# Patient Record
Sex: Male | Born: 1945
Health system: Southern US, Community
[De-identification: ages and names within clinical notes are randomized; demographics above are authoritative.]

## PROBLEM LIST (undated history)

## (undated) DIAGNOSIS — R0683 Snoring: Secondary | ICD-10-CM

## (undated) DIAGNOSIS — I499 Cardiac arrhythmia, unspecified: Secondary | ICD-10-CM

## (undated) DIAGNOSIS — I428 Other cardiomyopathies: Secondary | ICD-10-CM

## (undated) DIAGNOSIS — Z87898 Personal history of other specified conditions: Secondary | ICD-10-CM

## (undated) DIAGNOSIS — Z9889 Other specified postprocedural states: Secondary | ICD-10-CM

## (undated) DIAGNOSIS — I2510 Atherosclerotic heart disease of native coronary artery without angina pectoris: Secondary | ICD-10-CM

## (undated) DIAGNOSIS — F99 Mental disorder, not otherwise specified: Secondary | ICD-10-CM

## (undated) DIAGNOSIS — M1990 Unspecified osteoarthritis, unspecified site: Secondary | ICD-10-CM

## (undated) DIAGNOSIS — I493 Ventricular premature depolarization: Secondary | ICD-10-CM

## (undated) DIAGNOSIS — N529 Male erectile dysfunction, unspecified: Secondary | ICD-10-CM

## (undated) DIAGNOSIS — Z8719 Personal history of other diseases of the digestive system: Secondary | ICD-10-CM

## (undated) DIAGNOSIS — C801 Malignant (primary) neoplasm, unspecified: Secondary | ICD-10-CM

## (undated) DIAGNOSIS — I5020 Unspecified systolic (congestive) heart failure: Secondary | ICD-10-CM

## (undated) HISTORY — DX: Ventricular premature depolarization: I49.3

## (undated) HISTORY — DX: Personal history of other diseases of the digestive system: Z87.19

## (undated) HISTORY — DX: Atherosclerotic heart disease of native coronary artery without angina pectoris: I25.10

## (undated) HISTORY — DX: Unspecified systolic (congestive) heart failure: I50.20

## (undated) HISTORY — DX: Male erectile dysfunction, unspecified: N52.9

## (undated) HISTORY — DX: Cardiac arrhythmia, unspecified: I49.9

## (undated) HISTORY — PX: HERNIA REPAIR: SHX51

## (undated) HISTORY — DX: Other specified postprocedural states: Z98.890

## (undated) HISTORY — DX: Other cardiomyopathies: I42.8

---
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
## 2003-03-29 ENCOUNTER — Emergency Department (HOSPITAL_COMMUNITY): Admission: EM | Admit: 2003-03-29 | Discharge: 2003-03-29 | Payer: Self-pay | Admitting: Emergency Medicine

## 2003-03-29 ENCOUNTER — Encounter: Payer: Self-pay | Admitting: Emergency Medicine

## 2003-10-14 ENCOUNTER — Ambulatory Visit (HOSPITAL_COMMUNITY): Admission: RE | Admit: 2003-10-14 | Discharge: 2003-10-14 | Payer: Self-pay | Admitting: Internal Medicine

## 2011-07-13 ENCOUNTER — Ambulatory Visit (INDEPENDENT_AMBULATORY_CARE_PROVIDER_SITE_OTHER): Payer: BC Managed Care – PPO | Admitting: General Surgery

## 2011-07-13 ENCOUNTER — Encounter (INDEPENDENT_AMBULATORY_CARE_PROVIDER_SITE_OTHER): Payer: Self-pay | Admitting: General Surgery

## 2011-07-13 VITALS — BP 112/72 | HR 80 | Temp 98.2°F | Resp 20 | Ht 72.0 in | Wt 215.2 lb

## 2011-07-13 DIAGNOSIS — K4091 Unilateral inguinal hernia, without obstruction or gangrene, recurrent: Secondary | ICD-10-CM | POA: Insufficient documentation

## 2011-07-13 NOTE — Patient Instructions (Signed)
Call we are ready to schedule surgery.

## 2011-07-13 NOTE — Progress Notes (Signed)
Subjective:   Right groin hernia  Patient ID: Frederick Goodwin, male   DOB: 1945-11-24, 66 y.o.   MRN: [id number]  HPI Patient is a very pleasant 66 year old male who is the father of Judeth Cornfield [place] at [facility]. For about 2-3 months he has noted a gradually increasing bulge in his right groin. This has been somewhat uncomfortable but no severe pain. He's had nothing that sounds like incarceration. He has no GI symptoms. His colonoscopy is up to date. He has a significant history of right inguinal hernia repair as an infant. He has not noted any symptoms on the left.  Past Medical History  Diagnosis Date  . History of inguinal hernia repair     right   Past Surgical History  Procedure Date  . Hernia repair     RIH as a child   Current Outpatient Prescriptions  Medication Sig Dispense Refill  . fish oil-omega-3 fatty acids 1000 MG capsule Take 2 g by mouth daily.       No Known Allergies History  Substance Use Topics  . Smoking status: Former Games developer  . Smokeless tobacco: Not on file  . Alcohol Use: Yes  Solicitor  Review of Systems  Constitutional: Negative.   HENT: Negative.   Respiratory: Negative.   Cardiovascular: Negative.   Gastrointestinal: Negative.        Objective:   Physical Exam BP 112/72  Pulse 80  Temp(Src) 98.2 F (36.8 C) (Temporal)  Resp 20  Ht 6' (1.829 m)  Wt 215 lb 3.2 oz (97.614 kg)  BMI 29.19 kg/m2 Body mass index is 29.19 kg/(m^2). lGeneral: Alert, mildly overweight Caucasian male, in no distress Skin: Warm and dry without rash or infection. HEENT: No palpable masses or thyromegaly. Sclera nonicteric. Pupils equal round and reactive. Oropharynx clear. Lymph nodes: No cervical, supraclavicular, or inguinal nodes palpable. Lungs: Breath sounds clear and equal without increased work of breathing Cardiovascular: Regular rate and rhythm without murmur. No JVD or edema. Peripheral pulses intact. Abdomen: Nondistended. Soft and nontender.  No masses palpable. No organomegaly. In the right groin there is a moderate sized somewhat tender reducible right inguinal hernia that contains bowel. I cannot feel any hernia on the left. Extremities: No edema or joint swelling or deformity. No chronic venous stasis changes. Neurologic: Alert and fully oriented. Gait normal.     Assessment:     Symptomatic recurrent right inguinal hernia. I've recommended laparoscopic repair due to the recurrent nature. We discussed the nature of the procedure, indications, risks of anesthetic complications, bleeding, infection, recurrence, chronic pain. He would like to think about scheduling and call us back.    Plan:     Laparoscopic repair of recurrent right inguinal hernia under general anesthesia as an outpatient.

## 2011-08-01 ENCOUNTER — Encounter (HOSPITAL_COMMUNITY): Payer: Self-pay | Admitting: Pharmacy Technician

## 2011-08-08 ENCOUNTER — Other Ambulatory Visit (INDEPENDENT_AMBULATORY_CARE_PROVIDER_SITE_OTHER): Payer: Self-pay | Admitting: General Surgery

## 2011-08-09 ENCOUNTER — Other Ambulatory Visit (HOSPITAL_COMMUNITY): Payer: BC Managed Care – PPO

## 2011-08-09 ENCOUNTER — Encounter (HOSPITAL_COMMUNITY): Payer: Self-pay

## 2011-08-09 ENCOUNTER — Encounter (HOSPITAL_COMMUNITY)
Admission: RE | Admit: 2011-08-09 | Discharge: 2011-08-09 | Disposition: A | Discharge status: Home / Self Care | Payer: BC Managed Care – PPO | Source: Ambulatory Visit | Attending: General Surgery | Admitting: General Surgery

## 2011-08-09 HISTORY — DX: Mental disorder, not otherwise specified: F99

## 2011-08-09 HISTORY — DX: Unspecified osteoarthritis, unspecified site: M19.90

## 2011-08-09 HISTORY — DX: Personal history of other specified conditions: Z87.898

## 2011-08-09 HISTORY — DX: Snoring: R06.83

## 2011-08-09 HISTORY — DX: Malignant (primary) neoplasm, unspecified: C80.1

## 2011-08-09 LAB — CBC
HCT: 42 % (ref 39.0–52.0)
Hemoglobin: 15 g/dL (ref 13.0–17.0)
MCH: 33.1 pg (ref 26.0–34.0)
MCHC: 35.7 g/dL (ref 30.0–36.0)
MCV: 92.7 fL (ref 78.0–100.0)
Platelets: 240 10*3/uL (ref 150–400)
RBC: 4.53 MIL/uL (ref 4.22–5.81)
RDW: 12.9 % (ref 11.5–15.5)
WBC: 6.5 10*3/uL (ref 4.0–10.5)

## 2011-08-09 LAB — SURGICAL PCR SCREEN
MRSA, PCR: NEGATIVE
Staphylococcus aureus: NEGATIVE

## 2011-08-09 MED ORDER — CHLORHEXIDINE GLUCONATE 4 % EX LIQD: 1.0000 "application " | Freq: Once | CUTANEOUS | Status: DC

## 2011-08-09 NOTE — Patient Instructions (Signed)
20 Frederick Goodwin  08/09/2011   Your procedure is scheduled on:  08/14/10  Report to [facility] Stay [facility] at 6:30 AM.  Call this number if you have problems the morning of surgery: [phone]   Remember:   Do not eat food:After Midnight.  May have clear liquids: up to 4 Hrs. before arrival.( 6 HRS BEFORE SURGERY)  Clear liquids include soda, tea, black coffee, apple or grape juice, broth.  Take these medicines the morning of surgery with A SIP OF WATER: NONE   Do not wear jewelry, make-up or nail polish.  Do not wear lotions, powders, or perfumes.   Do not shave underarms or legs 48 hours prior to surgery (men may shave face)  Do not bring valuables to the hospital.  Contacts, dentures or bridgework may not be worn into surgery.  Leave suitcase in the car. After surgery it may be brought to your room.  For patients admitted to the hospital, checkout time is 11:00 AM the day of discharge.   Patients discharged the day of surgery will not be allowed to drive home.  Name and phone number of your driver:   Special Instructions: CHG Shower Use Special Wash: 1/2 bottle night before surgery and 1/2 bottle morning of surgery.   Please read over the following fact sheets that you were given: MRSA Information  20 Frederick Goodwin  08/09/2011

## 2011-08-15 ENCOUNTER — Ambulatory Visit (HOSPITAL_COMMUNITY)
Admission: RE | Admit: 2011-08-15 | Discharge: 2011-08-15 | Disposition: A | Discharge status: Home / Self Care | Payer: BC Managed Care – PPO | Source: Ambulatory Visit | Attending: General Surgery | Admitting: General Surgery

## 2011-08-15 ENCOUNTER — Ambulatory Visit (HOSPITAL_COMMUNITY): Payer: BC Managed Care – PPO | Admitting: Anesthesiology

## 2011-08-15 ENCOUNTER — Encounter (HOSPITAL_COMMUNITY): Payer: Self-pay | Admitting: Anesthesiology

## 2011-08-15 ENCOUNTER — Encounter (HOSPITAL_COMMUNITY)
Admission: RE | Disposition: A | Discharge status: Home / Self Care | Payer: Self-pay | Source: Ambulatory Visit | Attending: General Surgery

## 2011-08-15 ENCOUNTER — Encounter (HOSPITAL_COMMUNITY): Payer: Self-pay | Admitting: *Deleted

## 2011-08-15 DIAGNOSIS — K4091 Unilateral inguinal hernia, without obstruction or gangrene, recurrent: Secondary | ICD-10-CM

## 2011-08-15 DIAGNOSIS — Z01812 Encounter for preprocedural laboratory examination: Secondary | ICD-10-CM | POA: Insufficient documentation

## 2011-08-15 HISTORY — PX: INGUINAL HERNIA REPAIR: SHX194

## 2011-08-15 SURGERY — REPAIR, HERNIA, INGUINAL, LAPAROSCOPIC
Anesthesia: General | Site: Groin | Laterality: Right | Wound class: Clean

## 2011-08-15 MED ORDER — LACTATED RINGERS IV SOLN: INTRAVENOUS | Status: DC

## 2011-08-15 MED ORDER — OXYCODONE-ACETAMINOPHEN 5-325 MG PO TABS: 1.0000 | ORAL_TABLET | ORAL | Status: AC | PRN

## 2011-08-15 MED ORDER — NEOSTIGMINE METHYLSULFATE 1 MG/ML IJ SOLN
INTRAMUSCULAR | Status: DC | PRN
  Administered 2011-08-15: 5 mg via INTRAVENOUS

## 2011-08-15 MED ORDER — BUPIVACAINE LIPOSOME 1.3 % IJ SUSP
20.0000 mL | INTRAMUSCULAR | Status: DC
  Filled 2011-08-15: qty 20

## 2011-08-15 MED ORDER — DROPERIDOL 2.5 MG/ML IJ SOLN
INTRAMUSCULAR | Status: DC | PRN
  Administered 2011-08-15: 0.625 mg via INTRAVENOUS

## 2011-08-15 MED ORDER — BUPIVACAINE-EPINEPHRINE PF 0.25-1:200000 % IJ SOLN
INTRAMUSCULAR | Status: AC
  Filled 2011-08-15: qty 60

## 2011-08-15 MED ORDER — LACTATED RINGERS IV SOLN
INTRAVENOUS | Status: DC | PRN
  Administered 2011-08-15 (×2): via INTRAVENOUS

## 2011-08-15 MED ORDER — LIDOCAINE HCL (CARDIAC) 20 MG/ML IV SOLN
INTRAVENOUS | Status: DC | PRN
  Administered 2011-08-15: 100 mg via INTRAVENOUS

## 2011-08-15 MED ORDER — PROPOFOL 10 MG/ML IV EMUL
INTRAVENOUS | Status: DC | PRN
  Administered 2011-08-15: 200 mg via INTRAVENOUS

## 2011-08-15 MED ORDER — PROMETHAZINE HCL 25 MG/ML IJ SOLN: 6.2500 mg | INTRAMUSCULAR | Status: DC | PRN

## 2011-08-15 MED ORDER — SUCCINYLCHOLINE CHLORIDE 20 MG/ML IJ SOLN
INTRAMUSCULAR | Status: DC | PRN
  Administered 2011-08-15: 140 mg via INTRAVENOUS

## 2011-08-15 MED ORDER — CEFAZOLIN SODIUM-DEXTROSE 2-3 GM-% IV SOLR
2.0000 g | Freq: Once | INTRAVENOUS | Status: AC
  Administered 2011-08-15: 2 g via INTRAVENOUS

## 2011-08-15 MED ORDER — ONDANSETRON HCL 4 MG/2ML IJ SOLN
INTRAMUSCULAR | Status: DC | PRN
  Administered 2011-08-15: 4 mg via INTRAVENOUS

## 2011-08-15 MED ORDER — BUPIVACAINE-EPINEPHRINE 0.25% -1:200000 IJ SOLN
INTRAMUSCULAR | Status: DC | PRN
  Administered 2011-08-15 (×2): 10 mL

## 2011-08-15 MED ORDER — FENTANYL CITRATE 0.05 MG/ML IJ SOLN
INTRAMUSCULAR | Status: DC | PRN
  Administered 2011-08-15: 50 ug via INTRAVENOUS
  Administered 2011-08-15 (×2): 100 ug via INTRAVENOUS

## 2011-08-15 MED ORDER — ROCURONIUM BROMIDE 100 MG/10ML IV SOLN
INTRAVENOUS | Status: DC | PRN
  Administered 2011-08-15 (×2): 10 mg via INTRAVENOUS
  Administered 2011-08-15: 40 mg via INTRAVENOUS

## 2011-08-15 MED ORDER — HYDROMORPHONE HCL PF 1 MG/ML IJ SOLN
INTRAMUSCULAR | Status: DC | PRN
  Administered 2011-08-15 (×2): 1 mg via INTRAVENOUS

## 2011-08-15 MED ORDER — MIDAZOLAM HCL 5 MG/5ML IJ SOLN
INTRAMUSCULAR | Status: DC | PRN
  Administered 2011-08-15: 2 mg via INTRAVENOUS

## 2011-08-15 MED ORDER — FENTANYL CITRATE 0.05 MG/ML IJ SOLN: 25.0000 ug | INTRAMUSCULAR | Status: DC | PRN

## 2011-08-15 MED ORDER — GLYCOPYRROLATE 0.2 MG/ML IJ SOLN
INTRAMUSCULAR | Status: DC | PRN
  Administered 2011-08-15: .7 mg via INTRAVENOUS

## 2011-08-15 MED ORDER — ACETAMINOPHEN 10 MG/ML IV SOLN
INTRAVENOUS | Status: AC
  Filled 2011-08-15: qty 100

## 2011-08-15 MED ORDER — DEXAMETHASONE SODIUM PHOSPHATE 10 MG/ML IJ SOLN
INTRAMUSCULAR | Status: DC | PRN
  Administered 2011-08-15: 10 mg via INTRAVENOUS

## 2011-08-15 MED ORDER — ACETAMINOPHEN 10 MG/ML IV SOLN
INTRAVENOUS | Status: DC | PRN
  Administered 2011-08-15: 1000 mg via INTRAVENOUS

## 2011-08-15 MED ORDER — CEFAZOLIN SODIUM-DEXTROSE 2-3 GM-% IV SOLR
INTRAVENOUS | Status: AC
  Filled 2011-08-15: qty 50

## 2011-08-15 SURGICAL SUPPLY — 40 items
ADH SKN CLS APL DERMABOND .7 (GAUZE/BANDAGES/DRESSINGS) ×1
APL SKNCLS STERI-STRIP NONHPOA (GAUZE/BANDAGES/DRESSINGS) ×1
APPLIER CLIP 5 13 M/L LIGAMAX5 (MISCELLANEOUS)
APR CLP MED LRG 5 ANG JAW (MISCELLANEOUS)
BENZOIN TINCTURE PRP APPL 2/3 (GAUZE/BANDAGES/DRESSINGS) ×2 IMPLANT
CLIP APPLIE 5 13 M/L LIGAMAX5 (MISCELLANEOUS) IMPLANT
CLOTH BEACON ORANGE TIMEOUT ST (SAFETY) ×2 IMPLANT
DECANTER SPIKE VIAL GLASS SM (MISCELLANEOUS) ×1 IMPLANT
DERMABOND ADVANCED (GAUZE/BANDAGES/DRESSINGS) ×1
DERMABOND ADVANCED .7 DNX12 (GAUZE/BANDAGES/DRESSINGS) IMPLANT
DEVICE SECURE STRAP 25 ABSORB (INSTRUMENTS) ×1 IMPLANT
DISSECT BALLN SPACEMKR + OVL (BALLOONS) ×2
DISSECTOR BALLN SPACEMKR + OVL (BALLOONS) ×1 IMPLANT
DISSECTOR BLUNT TIP ENDO 5MM (MISCELLANEOUS) IMPLANT
DRAPE INCISE IOBAN 66X45 STRL (DRAPES) ×1 IMPLANT
DRAPE LAPAROSCOPIC ABDOMINAL (DRAPES) ×2 IMPLANT
ELECT REM PT RETURN 9FT ADLT (ELECTROSURGICAL) ×2
ELECTRODE REM PT RTRN 9FT ADLT (ELECTROSURGICAL) ×1 IMPLANT
GLOVE BIOGEL PI IND STRL 7.0 (GLOVE) ×1 IMPLANT
GLOVE BIOGEL PI INDICATOR 7.0 (GLOVE) ×1
GOWN STRL NON-REIN LRG LVL3 (GOWN DISPOSABLE) ×3 IMPLANT
GOWN STRL REIN XL XLG (GOWN DISPOSABLE) ×4 IMPLANT
KIT BASIN OR (CUSTOM PROCEDURE TRAY) ×2 IMPLANT
MESH 3DMAX 5X7 RT XLRG (Mesh General) ×1 IMPLANT
NDL INSUFFLATION 14GA 120MM (NEEDLE) IMPLANT
NEEDLE INSUFFLATION 14GA 120MM (NEEDLE) ×2 IMPLANT
PEN SKIN MARKING BROAD (MISCELLANEOUS) ×2 IMPLANT
SCISSORS LAP 5X35 DISP (ENDOMECHANICALS) IMPLANT
SET IRRIG TUBING LAPAROSCOPIC (IRRIGATION / IRRIGATOR) IMPLANT
SOLUTION ANTI FOG 6CC (MISCELLANEOUS) ×2 IMPLANT
STRIP CLOSURE SKIN 1/2X4 (GAUZE/BANDAGES/DRESSINGS) ×2 IMPLANT
SUT MNCRL AB 4-0 PS2 18 (SUTURE) ×2 IMPLANT
SUT VIC AB 3-0 SH 27 (SUTURE)
SUT VIC AB 3-0 SH 27XBRD (SUTURE) IMPLANT
TACKER 5MM HERNIA 3.5CML NAB (ENDOMECHANICALS) ×2 IMPLANT
TOWEL OR 17X26 10 PK STRL BLUE (TOWEL DISPOSABLE) ×2 IMPLANT
TRAY FOLEY CATH 14FRSI W/METER (CATHETERS) ×2 IMPLANT
TRAY LAP CHOLE (CUSTOM PROCEDURE TRAY) ×2 IMPLANT
TROCAR CANNULA W/PORT DUAL 5MM (MISCELLANEOUS) ×4 IMPLANT
TUBING INSUFFLATION 10FT LAP (TUBING) ×2 IMPLANT

## 2011-08-15 NOTE — H&P (Signed)
  Subjective:   Patient is a very pleasant 66 year old male who is the father of Judeth Cornfield [place] at [facility]. For about 2-3 months he has noted a gradually increasing bulge in his right groin. This has been somewhat uncomfortable but no severe pain. He's had nothing that sounds like incarceration. He has no GI symptoms. His colonoscopy is up to date. He has a significant history of right inguinal hernia repair as an infant. He has not noted any symptoms on the left.      Objective:   BP 144/86  Pulse 73  Temp 97.7 F (36.5 C)  Resp 20  SpO2 100%  General:  alert and no distress Skin:  normal and no rash or abnormalities  Lungs:  clear to auscultation bilaterally Heart:  regular rate and rhythm Abdomen: normal findings: soft, non-tender, reducible RIH  Extremities:  extremities normal, atraumatic, no cyanosis or edema Neurologic:  negative Psychiatric:  normal mood, behavior, speech, dress, and thought processes    Assessment: Symptomatic reducible RIH   Plan:   1. Discussed the risk of surgery,  and the risks of general anesthetic including MI, CVA, sudden death or even reaction to anesthetic medications. The patient understands the risks, any and all questions were answered to the patient's satisfaction.   Date of Surgery Update  Mariella Saa MD, FACS  08/15/2011, 8:25 AM

## 2011-08-15 NOTE — Progress Notes (Signed)
Up to BR and voided small amt of urine. Ambulated in hall with very little assist 200 feet ant tolerated this well. Pt sitting in room drinking water at this time

## 2011-08-15 NOTE — Op Note (Signed)
Preoperative Diagnosis: right inguinal hernia, recurrent  Postoprative Diagnosis: right inguinal hernia, recurrent  Procedure: Procedure(s): LAPAROSCOPIC RECURRENT INGUINAL HERNIA REPAIR INSERTION OF MESH   Surgeon: Glenna Fellows T   Assistants: None  Anesthesia:  General endotracheal anesthesiaDiagnos  Indications:  Patient is a 66 year old male with a history of right inguinal hernia repair as a child. He now presents with an increasing and uncomfortable bulge at the right groin exam shows a moderate-sized reducible right inguinal hernia. We discussed options for repair and elected to proceed with laparoscopic repair. We discussed the nature of the procedure, its indications, risks of general anesthesia, cardiorespiratory complications, bleeding, infection, recurrence, possible need for open procedure. He is in agreement.   Procedure Detail: Patient is brought to the operating room, placed in the supine position on the operating table, and general endotracheal anesthesia induced. Foley catheter was placed. He received preoperative IV antibiotics. The abdomen and groins were widely sterilely prepped and draped and correct patient and procedure verified.  Local anesthesia was used to infiltrate the trocar sites. A 1-1/2 cm incision was made just beneath the umbilicus and dissection carried down to the anterior fascia. This was incised for 1 cm transversely just to the right of midline and the medial edge of the right rectus muscle retracted laterally and the preperitoneal space entered under direct vision. The balloon dissector was passed with its tip into the space and then down along the midline to the pubis. Under direct laparoscopic vision the balloon was inflated with good deployment. It was left in place for several minutes for hemostasis then removed and the balloon trocar inflated and CO2 pressure applied. There had been good dissection of the preperitoneal space. Under direct vision  25 mm trochars were placed. The pubic symphysis was identified and was cleared. There was an obvious direct hernia was seen the sac and preperitoneal fat adherent up to the pseudo-sac. I initially dissected laterally clearing peritoneum off the anterior abdominal wall out to the level of the anterior superior iliac spine as high as the umbilicus. The peritoneal edge was then followed back medially. The epigastric vessels and cord structures were identified. The peritoneum was posterior the cord structures and there was no evidence of indirect hernia. The herniated preperitoneal fat was stripped away from the pseudo-sac and a fairly large direct defect completely dissected and cleared posteriorly down to Cooper's ligament. Cooper's ligament was cleared and the iliac vessels which were protected. After this complete dissection a large right-sided Bard 3-D Max was chosen and introduced into the preperitoneal space. It was oriented and intact medially to the pubis and down along Cooper's ligament down to and avoiding the iliac vessels. The mesh was deployed well out laterally and beginning along the superior edge we could feel the tacker through the anterior abdominal wall the superior edge was tacked working back medially and finally the medial edge of the mesh was tacked. Several other tacks were placed in the inguinal ligament again be able to feel the tacker. This provided broad coverage of the direct and indirect spaces. Hemostasis was assured. All CO2 was evacuated and trochars removed. There was noted to be a moderate pneumoperitoneum and this was evacuated without difficulty with a varies needle in the right upper quadrant. The fascial defect at the umbilicus was closed with a figure-of-eight suture of 0 Vicryl. Skin was closed with subcuticular Monocryl and Dermabond. Sponge needle and instrument counts were correct.   Findings: Large direct hernia  Estimated Blood Loss:  Minimal  Drains:  none  Blood Given: none          Specimens: none        Complications:  * No complications entered in OR log *         Disposition: PACU - hemodynamically stable.         Condition: stable  Mariella Saa MD, FACS  08/15/2011, 10:17 AM

## 2011-08-15 NOTE — Progress Notes (Signed)
Again to BR and voided small amount. Bladder scan shows 246cc. Pt ambulating in hall without problem

## 2011-08-15 NOTE — Preoperative (Signed)
Beta Blockers   Reason not to administer Beta Blockers:Not Applicable 

## 2011-08-15 NOTE — Discharge Instructions (Signed)
[facility] _______[facility]  UMBILICAL OR INGUINAL HERNIA REPAIR: POST OP INSTRUCTIONS  Always review your discharge instruction sheet given to you by the facility where your surgery was performed. IF YOU HAVE DISABILITY OR FAMILY LEAVE FORMS, YOU MUST BRING THEM TO THE OFFICE FOR PROCESSING.   DO NOT GIVE THEM TO YOUR DOCTOR.  1. A  prescription for pain medication may be given to you upon discharge.  Take your pain medication as prescribed, if needed.  If narcotic pain medicine is not needed, then you may take acetaminophen (Tylenol) or ibuprofen (Advil) as needed. 2. Take your usually prescribed medications unless otherwise directed. 3. If you need a refill on your pain medication, please contact your pharmacy.  They will contact our office to request authorization. Prescriptions will not be filled after 5 pm or on week-ends. 4. You should follow a light diet the first 24 hours after arrival home, such as soup and crackers, etc.  Be sure to include lots of fluids daily.  Resume your normal diet the day after surgery. 5. Most patients will experience some swelling and bruising around the umbilicus or in the groin and scrotum.  Ice packs and reclining will help.  Swelling and bruising can take several days to resolve.  6. It is common to experience some constipation if taking pain medication after surgery.  Increasing fluid intake and taking a stool softener (such as Colace) will usually help or prevent this problem from occurring.  A mild laxative (Milk of Magnesia or Miralax) should be taken according to package directions if there are no bowel movements after 48 hours. 7. Unless discharge instructions indicate otherwise, you may remove your bandages 24-48 hours after surgery, and you may shower at that time.  You may have steri-strips (small skin tapes) in place directly over the incision.  These strips should be left on the skin for 7-10 days.  If your surgeon used skin glue on the  incision, you may shower in 24 hours.  The glue will flake off over the next 2-3 weeks.  Any sutures or staples will be removed at the office during your follow-up visit. 8. ACTIVITIES:  You may resume regular (light) daily activities beginning the next day--such as daily self-care, walking, climbing stairs--gradually increasing activities as tolerated.  You may have sexual intercourse when it is comfortable.  Refrain from any heavy lifting or straining until approved by your doctor. a. You may drive when you are no longer taking prescription pain medication, you can comfortably wear a seatbelt, and you can safely maneuver your car and apply brakes. b. RETURN TO WORK:  __________________________________________________________ 9. You should see your doctor in the office for a follow-up appointment approximately 2-3 weeks after your surgery.  Make sure that you call for this appointment within a day or two after you arrive home to insure a convenient appointment time. 10. OTHER INSTRUCTIONS:  __________________________________________________________________________________________________________________________________________________________________________________________  WHEN TO CALL YOUR DOCTOR: 1. Fever over 101.0 2. Inability to urinate 3. Nausea and/or vomiting 4. Extreme swelling or bruising 5. Continued bleeding from incision. 6. Increased pain, redness, or drainage from the incision  The clinic staff is available to answer your questions during regular business hours.  Please don't hesitate to call and ask to speak to one of the nurses for clinical concerns.  If you have a medical emergency, go to the nearest emergency room or call 911.  A surgeon from [facility] is always on call at the hospital     [address], [address], [place], [place]  [address] ?  [address] [address], [place], [place]   [address] [phone] ? [phone] ? FAX [phone] Web site:  [email or web address]  

## 2011-08-15 NOTE — Transfer of Care (Signed)
Immediate Anesthesia Transfer of Care Note  Patient: Frederick Goodwin  Procedure(s) Performed: Procedure(s) (LRB): LAPAROSCOPIC INGUINAL HERNIA (Right) INSERTION OF MESH (Right)  Patient Location: PACU  Anesthesia Type: General  Level of Consciousness: awake, alert , sedated and patient cooperative  Airway & Oxygen Therapy: Patient Spontanous Breathing and Patient connected to face mask oxygen  Post-op Assessment: Report given to PACU RN, Post -op Vital signs reviewed and stable and Patient moving all extremities  Post vital signs: Reviewed and stable  Complications: No apparent anesthesia complications

## 2011-08-15 NOTE — Progress Notes (Signed)
Up to BR voided moderate amount clear yellow urine

## 2011-08-15 NOTE — Anesthesia Postprocedure Evaluation (Signed)
Anesthesia Post Note  Patient: Frederick Goodwin  Procedure(s) Performed: Procedure(s) (LRB): LAPAROSCOPIC INGUINAL HERNIA (Right) INSERTION OF MESH (Right)  Anesthesia type: General  Patient location: PACU  Post pain: Pain level controlled  Post assessment: Post-op Vital signs reviewed  Last Vitals:  Filed Vitals:   08/15/11 1100  BP:   Pulse: 63  Temp:   Resp: 19    Post vital signs: Reviewed  Level of consciousness: sedated  Complications: No apparent anesthesia complications

## 2011-08-15 NOTE — Anesthesia Preprocedure Evaluation (Addendum)
Anesthesia Evaluation  Patient identified by MRN, date of birth, ID band Patient awake    Reviewed: Allergy & Precautions, H&P , NPO status , Patient's Chart, lab work & pertinent test results  History of Anesthesia Complications Negative for: history of anesthetic complications  Airway Mallampati: II TM Distance: >3 FB Neck ROM: Full    Dental  (+) Teeth Intact and Dental Advisory Given   Pulmonary neg pulmonary ROS,  clear to auscultation  Pulmonary exam normal       Cardiovascular neg cardio ROS Regular Normal    Neuro/Psych Negative Neurological ROS  Negative Psych ROS   GI/Hepatic negative GI ROS, Neg liver ROS,   Endo/Other  Negative Endocrine ROS  Renal/GU negative Renal ROS  Genitourinary negative   Musculoskeletal negative musculoskeletal ROS (+)   Abdominal Normal abdominal exam  (+)   Peds negative pediatric ROS (+)  Hematology negative hematology ROS (+)   Anesthesia Other Findings   Reproductive/Obstetrics negative OB ROS                           Anesthesia Physical Anesthesia Plan  ASA: I  Anesthesia Plan: General   Post-op Pain Management:    Induction: Intravenous  Airway Management Planned: Oral ETT  Additional Equipment:   Intra-op Plan:   Post-operative Plan: Extubation in OR  Informed Consent: I have reviewed the patients History and Physical, chart, labs and discussed the procedure including the risks, benefits and alternatives for the proposed anesthesia with the patient or authorized representative who has indicated his/her understanding and acceptance.   Dental advisory given  Plan Discussed with: CRNA  Anesthesia Plan Comments:         Anesthesia Quick Evaluation

## 2011-08-20 ENCOUNTER — Telehealth (INDEPENDENT_AMBULATORY_CARE_PROVIDER_SITE_OTHER): Payer: Self-pay

## 2011-08-20 NOTE — Telephone Encounter (Signed)
Patient called asking questions of what he can do activity wise. I told him no lifting over 20 lbs for 6 weeks. 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 I told him to take it easy at first and increase activity as he gets further out from surgery.

## 2011-08-30 ENCOUNTER — Ambulatory Visit (INDEPENDENT_AMBULATORY_CARE_PROVIDER_SITE_OTHER): Payer: BC Managed Care – PPO | Admitting: General Surgery

## 2011-08-30 ENCOUNTER — Encounter (INDEPENDENT_AMBULATORY_CARE_PROVIDER_SITE_OTHER): Payer: Self-pay | Admitting: General Surgery

## 2011-08-30 VITALS — BP 136/88 | HR 90 | Temp 98.2°F | Ht 72.0 in | Wt 212.6 lb

## 2011-08-30 DIAGNOSIS — Z09 Encounter for follow-up examination after completed treatment for conditions other than malignant neoplasm: Secondary | ICD-10-CM

## 2011-08-30 NOTE — Progress Notes (Signed)
History: Patient returns approximately 2 weeks following laparoscopic repair of recurrent right femoral hernia. He's had some expected soreness that is up and around without too much difficulty and back at work. No significant pain or other major complaints.  Exam: The repair feels solid. Incisions are well-healed without complications.  Assessment plan: Doing well following laparoscopic repair of recurrent right inguinal hernia. We discussed gradual return to full activity over the next week or so. Will return as needed.

## 2011-09-03 ENCOUNTER — Encounter (HOSPITAL_COMMUNITY): Payer: Self-pay | Admitting: General Surgery

## 2011-09-14 ENCOUNTER — Encounter (INDEPENDENT_AMBULATORY_CARE_PROVIDER_SITE_OTHER): Payer: BC Managed Care – PPO | Admitting: General Surgery

## 2016-03-30 DIAGNOSIS — N529 Male erectile dysfunction, unspecified: Secondary | ICD-10-CM | POA: Diagnosis not present

## 2016-03-30 DIAGNOSIS — Z136 Encounter for screening for cardiovascular disorders: Secondary | ICD-10-CM | POA: Diagnosis not present

## 2016-03-30 DIAGNOSIS — Z1211 Encounter for screening for malignant neoplasm of colon: Secondary | ICD-10-CM | POA: Diagnosis not present

## 2016-03-30 DIAGNOSIS — Z131 Encounter for screening for diabetes mellitus: Secondary | ICD-10-CM | POA: Diagnosis not present

## 2016-03-30 DIAGNOSIS — Z23 Encounter for immunization: Secondary | ICD-10-CM | POA: Diagnosis not present

## 2016-03-30 DIAGNOSIS — Z125 Encounter for screening for malignant neoplasm of prostate: Secondary | ICD-10-CM | POA: Diagnosis not present

## 2016-03-30 DIAGNOSIS — Z0000 Encounter for general adult medical examination without abnormal findings: Secondary | ICD-10-CM | POA: Diagnosis not present

## 2016-04-26 DIAGNOSIS — H2513 Age-related nuclear cataract, bilateral: Secondary | ICD-10-CM | POA: Diagnosis not present

## 2016-04-26 DIAGNOSIS — H40033 Anatomical narrow angle, bilateral: Secondary | ICD-10-CM | POA: Diagnosis not present

## 2016-08-14 DIAGNOSIS — Z1211 Encounter for screening for malignant neoplasm of colon: Secondary | ICD-10-CM | POA: Diagnosis not present

## 2016-08-27 DIAGNOSIS — D225 Melanocytic nevi of trunk: Secondary | ICD-10-CM | POA: Diagnosis not present

## 2016-08-27 DIAGNOSIS — D2262 Melanocytic nevi of left upper limb, including shoulder: Secondary | ICD-10-CM | POA: Diagnosis not present

## 2016-08-27 DIAGNOSIS — Z85828 Personal history of other malignant neoplasm of skin: Secondary | ICD-10-CM | POA: Diagnosis not present

## 2016-08-27 DIAGNOSIS — D2261 Melanocytic nevi of right upper limb, including shoulder: Secondary | ICD-10-CM | POA: Diagnosis not present

## 2017-01-07 DIAGNOSIS — D2111 Benign neoplasm of connective and other soft tissue of right upper limb, including shoulder: Secondary | ICD-10-CM | POA: Diagnosis not present

## 2017-04-11 DIAGNOSIS — J0140 Acute pansinusitis, unspecified: Secondary | ICD-10-CM | POA: Diagnosis not present

## 2017-04-11 DIAGNOSIS — J029 Acute pharyngitis, unspecified: Secondary | ICD-10-CM | POA: Diagnosis not present

## 2017-05-09 DIAGNOSIS — Z125 Encounter for screening for malignant neoplasm of prostate: Secondary | ICD-10-CM | POA: Diagnosis not present

## 2017-05-09 DIAGNOSIS — Z0000 Encounter for general adult medical examination without abnormal findings: Secondary | ICD-10-CM | POA: Diagnosis not present

## 2017-05-09 DIAGNOSIS — N529 Male erectile dysfunction, unspecified: Secondary | ICD-10-CM | POA: Diagnosis not present

## 2017-05-09 DIAGNOSIS — Z1159 Encounter for screening for other viral diseases: Secondary | ICD-10-CM | POA: Diagnosis not present

## 2017-05-21 ENCOUNTER — Other Ambulatory Visit: Payer: Self-pay | Admitting: Family Medicine

## 2017-05-21 ENCOUNTER — Other Ambulatory Visit: Payer: Self-pay | Admitting: Diagnostic Radiology

## 2017-05-21 DIAGNOSIS — Z136 Encounter for screening for cardiovascular disorders: Secondary | ICD-10-CM

## 2017-06-03 ENCOUNTER — Ambulatory Visit
Admission: RE | Admit: 2017-06-03 | Discharge: 2017-06-03 | Disposition: A | Discharge status: Home / Self Care | Payer: Self-pay | Source: Ambulatory Visit | Attending: Family Medicine | Admitting: Family Medicine

## 2017-06-03 DIAGNOSIS — Z136 Encounter for screening for cardiovascular disorders: Secondary | ICD-10-CM

## 2017-07-05 DIAGNOSIS — N5201 Erectile dysfunction due to arterial insufficiency: Secondary | ICD-10-CM | POA: Diagnosis not present

## 2017-09-11 DIAGNOSIS — D1801 Hemangioma of skin and subcutaneous tissue: Secondary | ICD-10-CM | POA: Diagnosis not present

## 2017-09-11 DIAGNOSIS — L821 Other seborrheic keratosis: Secondary | ICD-10-CM | POA: Diagnosis not present

## 2017-09-11 DIAGNOSIS — Z85828 Personal history of other malignant neoplasm of skin: Secondary | ICD-10-CM | POA: Diagnosis not present

## 2017-09-11 DIAGNOSIS — I788 Other diseases of capillaries: Secondary | ICD-10-CM | POA: Diagnosis not present

## 2017-09-14 DIAGNOSIS — J00 Acute nasopharyngitis [common cold]: Secondary | ICD-10-CM | POA: Diagnosis not present

## 2017-09-14 DIAGNOSIS — I499 Cardiac arrhythmia, unspecified: Secondary | ICD-10-CM | POA: Diagnosis not present

## 2017-09-16 ENCOUNTER — Telehealth: Payer: Self-pay

## 2017-09-16 NOTE — Telephone Encounter (Signed)
SENT REFERRAL TO SCHEDULING FROM DR Christella Noa [facility] #[phone]

## 2017-09-16 NOTE — Telephone Encounter (Signed)
SENT NOTES TO SCHEDULING 

## 2017-09-18 ENCOUNTER — Telehealth: Payer: Self-pay

## 2017-09-18 NOTE — Telephone Encounter (Signed)
NOTES ON FILE 

## 2017-10-07 ENCOUNTER — Encounter (INDEPENDENT_AMBULATORY_CARE_PROVIDER_SITE_OTHER): Payer: Self-pay

## 2017-10-07 ENCOUNTER — Ambulatory Visit: Payer: Medicare Other | Admitting: Internal Medicine

## 2017-10-07 VITALS — BP 132/74 | HR 81 | Ht 72.0 in | Wt 219.4 lb

## 2017-10-07 DIAGNOSIS — I499 Cardiac arrhythmia, unspecified: Secondary | ICD-10-CM | POA: Insufficient documentation

## 2017-10-07 DIAGNOSIS — I454 Nonspecific intraventricular block: Secondary | ICD-10-CM | POA: Diagnosis not present

## 2017-10-07 DIAGNOSIS — I493 Ventricular premature depolarization: Secondary | ICD-10-CM

## 2017-10-07 NOTE — Patient Instructions (Addendum)
Medication Instructions:  Your physician recommends that you continue on your current medications as directed. Please refer to the Current Medication list given to you today.  -- If you need a refill on your cardiac medications before your next appointment, please call your pharmacy. --  Labwork: BMET/ TSH/ MAGNESIUM  Testing/Procedures:Please schedule when available   Your physician has requested that you have an echocardiogram. Echocardiography is a painless test that uses sound waves to create images of your heart. It provides your doctor with information about the size and shape of your heart and how well your heart's chambers and valves are working. This procedure takes approximately one hour. There are no restrictions for this procedure.   Follow-Up: Your physician wants you to follow-up pending test results with Dr. Saunders Revel.     Thank you for choosing [facility]!!    Any Other Special Instructions Will Be Listed Below (If Applicable).

## 2017-10-07 NOTE — Progress Notes (Signed)
New Outpatient Visit Date: 10/07/2017  Referring Provider: Orpah Melter, [address] [place], [place] [address]  Chief Complaint: Abnormal EKG  HPI:  [name] is a 72 y.o. male who is being seen today for the evaluation of premature ventricular contractions at the request of Dr. Olen Pel. He has a history of peptic ulcer disease, erectile dysfunction, and skin cnacer.  He was evaluated at the [facility] clinic last month due to URI symptoms.  Cardiac exam was notable for an irregular rhythm with subsequent EKG demonstrating multifocal PVC's.  Mr. Ellithorpe was asymptomatic.  He denies experiencing palpitations as well as chest pain, shortness of breath, lightheadedness, edema, and orthopnea.  At the time that he presented to the urgent care clinic, he was using Allegra-D.  Since then, he has been using Allegra without the decongestant portion.  He feels well today.  Mr. Ria Comment denies a history of prior cardiovascular disease or testing.  He does not exercise regularly.  He recently began working as a Forensic psychologist.  --------------------------------------------------------------------------------------------------  Cardiovascular History & Procedures: Cardiovascular Problems:  PVCs  Risk Factors:  Age greater than 23 and male gender  Cath/PCI:  None  CV Surgery:  None  EP Procedures and Devices:  None  Non-Invasive Evaluation(s):  None  --------------------------------------------------------------------------------------------------  Past Medical History:  Diagnosis Date  . Arrhythmia   . Arthritis   . Cancer ([place])   . Erectile dysfunction   . History of inguinal hernia repair    right  . Hx of ulcer disease    20 YRS AGO  . Mental disorder SKIN CANCER  . PVC (premature ventricular contraction)   . Snores     Past Surgical History:  Procedure Laterality Date  . HERNIA REPAIR     RIH as a child  . INGUINAL HERNIA REPAIR  08/15/2011   Procedure: LAPAROSCOPIC INGUINAL HERNIA;  Surgeon: Edward Jolly, MD;  Location: [facility];  Service: General;  Laterality: Right;  LAPAROSCOPIC RIGHT INGUINAL HERNIA Repair  . TONSILLECTOMY  1966    Current Meds  Medication Sig  . fexofenadine (ALLEGRA) 180 MG tablet Take 180 mg by mouth daily.  . Multiple Vitamin (MULTIVITAMIN) tablet Take 1 tablet by mouth daily.  . sildenafil (VIAGRA) 100 MG tablet Take 100 mg by mouth as directed.    Allergies: Patient has no known allergies.  Social History   Socioeconomic History  . Marital status: Married    Spouse name: Not on file  . Number of children: Not on file  . Years of education: Not on file  . Highest education level: Not on file  Occupational History  . Not on file  Social Needs  . Financial resource strain: Not on file  . Food insecurity:    Worry: Not on file    Inability: Not on file  . Transportation needs:    Medical: Not on file    Non-medical: Not on file  Tobacco Use  . Smoking status: Former Smoker    Packs/day: 1.00    Years: 30.00    Pack years: 30.00    Types: Cigarettes    Last attempt to quit: 1999    Years since quitting: 20.2  . Smokeless tobacco: Never Used  Substance and Sexual Activity  . Alcohol use: Yes    Alcohol/week: 4.8 oz    Types: 1 Standard drinks or equivalent, 7 Glasses of wine per week  . Drug use: No  . Sexual activity: Not on  file  Lifestyle  . Physical activity:    Days per week: Not on file    Minutes per session: Not on file  . Stress: Not on file  Relationships  . Social connections:    Talks on phone: Not on file    Gets together: Not on file    Attends religious service: Not on file    Active member of club or organization: Not on file    Attends meetings of clubs or organizations: Not on file    Relationship status: Not on file  . Intimate partner violence:    Fear of current or ex partner: Not on file    Emotionally abused: Not on file    Physically abused:  Not on file    Forced sexual activity: Not on file  Other Topics Concern  . Not on file  Social History Narrative  . Not on file    Family History  Problem Relation Age of Onset  . Heart disease Mother        Atrial fibrillation  . COPD Mother   . Cancer Father        leukemia    Review of Systems: Review of systems is remarkable for snoring.  Otherwise, a 12-system review of systems was performed and was negative except as noted in the HPI.  --------------------------------------------------------------------------------------------------  Physical Exam: BP 132/74   Pulse 81   Ht 6' (1.829 m)   Wt 219 lb 6.4 oz (99.5 kg)   BMI 29.76 kg/m   General: No acute distress. HEENT: No conjunctival pallor or scleral icterus. Moist mucous membranes. OP clear. Neck: Supple without lymphadenopathy, thyromegaly, JVD, or HJR. No carotid bruit. Lungs: Normal work of breathing. Clear to auscultation bilaterally without wheezes or crackles. Heart: Regular rate and rhythm with occasional extrasystoles.  No rubs or gallops.  Nondisplaced PMI. Abd: Bowel sounds present. Soft, NT/ND without hepatosplenomegaly Ext: No lower extremity edema. Radial, PT, and DP pulses are 2+ bilaterally Skin: Warm and dry without rash. Neuro: CNIII-XII intact. Strength and fine-touch sensation intact in upper and lower extremities bilaterally. Psych: Normal mood and affect.  EKG: Normal sinus rhythm with nonspecific intraventricular conduction delay, left axis deviation, and PVCs.  Lab Results  Component Value Date   WBC 6.5 08/09/2011   HGB 15.0 08/09/2011   HCT 42.0 08/09/2011   MCV 92.7 08/09/2011   PLT 240 08/09/2011    Lab Results  Component Value Date   NA 137 10/07/2017   K 4.8 10/07/2017   CL 100 10/07/2017   CO2 23 10/07/2017   BUN 16 10/07/2017   CREATININE 0.97 10/07/2017   GLUCOSE 90 10/07/2017    No results found for: CHOL, HDL, LDLCALC, LDLDIRECT, TRIG,  CHOLHDL   --------------------------------------------------------------------------------------------------  ASSESSMENT AND PLAN: PVCs and nonspecific intraventricular conduction defect PVCs are again noted on today's tracing.  Mr. Huguley does not have any symptoms.  We have agreed to obtain a basic metabolic panel, magnesium level, and TSH today to exclude electrolyte/hormonal causes of his PVCs.  I will also obtain a transthoracic echocardiogram to exclude structural abnormalities, particularly given concurrent IVCD.  If the echocardiogram is unrevealing, I recommend that we proceed with myocardial perfusion stress test to exclude ischemia.  If there is evidence of reduced LVEF or a wall motion abnormality, I would instead favor proceeding directly to cardiac catheterization.  I encouraged Mr. Bostwick to avoid stimulants, including over-the-counter decongestants and caffeine, as well as to limit his alcohol intake.  Follow-up: To  be determined based on results of aforementioned testing.  [name], MD 10/08/2017 9:32 PM

## 2017-10-07 NOTE — H&P (View-Only) (Signed)
New Outpatient Visit Date: 10/07/2017  Referring Provider: Orpah Melter, [place], [place] [address]  Chief Complaint: Abnormal EKG  HPI:  Frederick Goodwin is a 72 y.o. male who is being seen today for the evaluation of premature ventricular contractions at the request of Dr. Olen Pel. He has a history of peptic ulcer disease, erectile dysfunction, and skin cnacer.  He was evaluated at the [facility] clinic last month due to URI symptoms.  Cardiac exam was notable for an irregular rhythm with subsequent EKG demonstrating multifocal PVC's.  Frederick Goodwin was asymptomatic.  He denies experiencing palpitations as well as chest pain, shortness of breath, lightheadedness, edema, and orthopnea.  At the time that he presented to the urgent care clinic, he was using Allegra-D.  Since then, he has been using Allegra without the decongestant portion.  He feels well today.  Frederick Goodwin denies a history of prior cardiovascular disease or testing.  He does not exercise regularly.  He recently began working as a Forensic psychologist.  --------------------------------------------------------------------------------------------------  Cardiovascular History & Procedures: Cardiovascular Problems:  PVCs  Risk Factors:  Age greater than 74 and male gender  Cath/PCI:  None  CV Surgery:  None  EP Procedures and Devices:  None  Non-Invasive Evaluation(s):  None  --------------------------------------------------------------------------------------------------  Past Medical History:  Diagnosis Date  . Arrhythmia   . Arthritis   . Cancer ([place])   . Erectile dysfunction   . History of inguinal hernia repair    right  . Hx of ulcer disease    20 YRS AGO  . Mental disorder SKIN CANCER  . PVC (premature ventricular contraction)   . Snores     Past Surgical History:  Procedure Laterality Date  . HERNIA REPAIR     RIH as a child  . INGUINAL HERNIA REPAIR  08/15/2011   Procedure: LAPAROSCOPIC INGUINAL HERNIA;  Surgeon: Edward Jolly, MD;  Location: [facility];  Service: General;  Laterality: Right;  LAPAROSCOPIC RIGHT INGUINAL HERNIA Repair  . TONSILLECTOMY  1966    Current Meds  Medication Sig  . fexofenadine (ALLEGRA) 180 MG tablet Take 180 mg by mouth daily.  . Multiple Vitamin (MULTIVITAMIN) tablet Take 1 tablet by mouth daily.  . sildenafil (VIAGRA) 100 MG tablet Take 100 mg by mouth as directed.    Allergies: Patient has no known allergies.  Social History   Socioeconomic History  . Marital status: Married    Spouse name: Not on file  . Number of children: Not on file  . Years of education: Not on file  . Highest education level: Not on file  Occupational History  . Not on file  Social Needs  . Financial resource strain: Not on file  . Food insecurity:    Worry: Not on file    Inability: Not on file  . Transportation needs:    Medical: Not on file    Non-medical: Not on file  Tobacco Use  . Smoking status: Former Smoker    Packs/day: 1.00    Years: 30.00    Pack years: 30.00    Types: Cigarettes    Last attempt to quit: 1999    Years since quitting: 20.2  . Smokeless tobacco: Never Used  Substance and Sexual Activity  . Alcohol use: Yes    Alcohol/week: 4.8 oz    Types: 1 Standard drinks or equivalent, 7 Glasses of wine per week  . Drug use: No  . Sexual activity: Not on  file  Lifestyle  . Physical activity:    Days per week: Not on file    Minutes per session: Not on file  . Stress: Not on file  Relationships  . Social connections:    Talks on phone: Not on file    Gets together: Not on file    Attends religious service: Not on file    Active member of club or organization: Not on file    Attends meetings of clubs or organizations: Not on file    Relationship status: Not on file  . Intimate partner violence:    Fear of current or ex partner: Not on file    Emotionally abused: Not on file    Physically abused:  Not on file    Forced sexual activity: Not on file  Other Topics Concern  . Not on file  Social History Narrative  . Not on file    Family History  Problem Relation Age of Onset  . Heart disease Mother        Atrial fibrillation  . COPD Mother   . Cancer Father        leukemia    Review of Systems: Review of systems is remarkable for snoring.  Otherwise, a 12-system review of systems was performed and was negative except as noted in the HPI.  --------------------------------------------------------------------------------------------------  Physical Exam: BP 132/74   Pulse 81   Ht 6' (1.829 m)   Wt 219 lb 6.4 oz (99.5 kg)   BMI 29.76 kg/m   General: No acute distress. HEENT: No conjunctival pallor or scleral icterus. Moist mucous membranes. OP clear. Neck: Supple without lymphadenopathy, thyromegaly, JVD, or HJR. No carotid bruit. Lungs: Normal work of breathing. Clear to auscultation bilaterally without wheezes or crackles. Heart: Regular rate and rhythm with occasional extrasystoles.  No rubs or gallops.  Nondisplaced PMI. Abd: Bowel sounds present. Soft, NT/ND without hepatosplenomegaly Ext: No lower extremity edema. Radial, PT, and DP pulses are 2+ bilaterally Skin: Warm and dry without rash. Neuro: CNIII-XII intact. Strength and fine-touch sensation intact in upper and lower extremities bilaterally. Psych: Normal mood and affect.  EKG: Normal sinus rhythm with nonspecific intraventricular conduction delay, left axis deviation, and PVCs.  Lab Results  Component Value Date   WBC 6.5 08/09/2011   HGB 15.0 08/09/2011   HCT 42.0 08/09/2011   MCV 92.7 08/09/2011   PLT 240 08/09/2011    Lab Results  Component Value Date   NA 137 10/07/2017   K 4.8 10/07/2017   CL 100 10/07/2017   CO2 23 10/07/2017   BUN 16 10/07/2017   CREATININE 0.97 10/07/2017   GLUCOSE 90 10/07/2017    No results found for: CHOL, HDL, LDLCALC, LDLDIRECT, TRIG,  CHOLHDL   --------------------------------------------------------------------------------------------------  ASSESSMENT AND PLAN: PVCs and nonspecific intraventricular conduction defect PVCs are again noted on today's tracing.  Frederick Goodwin does not have any symptoms.  We have agreed to obtain a basic metabolic panel, magnesium level, and TSH today to exclude electrolyte/hormonal causes of his PVCs.  I will also obtain a transthoracic echocardiogram to exclude structural abnormalities, particularly given concurrent IVCD.  If the echocardiogram is unrevealing, I recommend that we proceed with myocardial perfusion stress test to exclude ischemia.  If there is evidence of reduced LVEF or a wall motion abnormality, I would instead favor proceeding directly to cardiac catheterization.  I encouraged Frederick Goodwin to avoid stimulants, including over-the-counter decongestants and caffeine, as well as to limit his alcohol intake.  Follow-up: To  be determined based on results of aforementioned testing.  [name], MD 10/08/2017 9:32 PM

## 2017-10-08 ENCOUNTER — Encounter: Payer: Self-pay | Admitting: Internal Medicine

## 2017-10-08 DIAGNOSIS — I454 Nonspecific intraventricular block: Secondary | ICD-10-CM | POA: Insufficient documentation

## 2017-10-08 LAB — BASIC METABOLIC PANEL
BUN/Creatinine Ratio: 16 (ref 10–24)
BUN: 16 mg/dL (ref 8–27)
CO2: 23 mmol/L (ref 20–29)
Calcium: 9.8 mg/dL (ref 8.6–10.2)
Chloride: 100 mmol/L (ref 96–106)
Creatinine, Ser: 0.97 mg/dL (ref 0.76–1.27)
GFR calc Af Amer: 91 mL/min/{1.73_m2} (ref >59)
GFR calc non Af Amer: 79 mL/min/{1.73_m2} (ref >59)
Glucose: 90 mg/dL (ref 65–99)
Potassium: 4.8 mmol/L (ref 3.5–5.2)
Sodium: 137 mmol/L (ref 134–144)

## 2017-10-08 LAB — TSH: TSH: 2.18 u[IU]/mL (ref 0.450–4.500)

## 2017-10-08 LAB — MAGNESIUM: Magnesium: 2.3 mg/dL (ref 1.6–2.3)

## 2017-10-11 ENCOUNTER — Other Ambulatory Visit (HOSPITAL_COMMUNITY): Payer: Medicare Other

## 2017-10-15 ENCOUNTER — Other Ambulatory Visit: Payer: Self-pay

## 2017-10-15 ENCOUNTER — Ambulatory Visit (HOSPITAL_COMMUNITY): Payer: Medicare Other | Attending: Cardiovascular Disease

## 2017-10-15 DIAGNOSIS — I493 Ventricular premature depolarization: Secondary | ICD-10-CM

## 2017-10-15 DIAGNOSIS — Z87891 Personal history of nicotine dependence: Secondary | ICD-10-CM | POA: Insufficient documentation

## 2017-10-15 DIAGNOSIS — I081 Rheumatic disorders of both mitral and tricuspid valves: Secondary | ICD-10-CM | POA: Insufficient documentation

## 2017-10-15 MED ORDER — PERFLUTREN LIPID MICROSPHERE
1.0000 mL | INTRAVENOUS | Status: AC | PRN
  Administered 2017-10-15: 2 mL via INTRAVENOUS

## 2017-10-16 ENCOUNTER — Other Ambulatory Visit: Payer: Self-pay

## 2017-10-16 ENCOUNTER — Telehealth: Payer: Self-pay | Admitting: Internal Medicine

## 2017-10-16 ENCOUNTER — Telehealth: Payer: Self-pay

## 2017-10-16 DIAGNOSIS — Z01812 Encounter for preprocedural laboratory examination: Secondary | ICD-10-CM

## 2017-10-16 MED ORDER — ASPIRIN EC 81 MG PO TBEC: 81.0000 mg | DELAYED_RELEASE_TABLET | Freq: Every day | ORAL | 3 refills | Status: AC

## 2017-10-16 NOTE — Telephone Encounter (Signed)
Spoke with patient and changed cath date

## 2017-10-16 NOTE — Telephone Encounter (Signed)
Results of echocardiogram were discussed with Frederick Goodwin by phone, including moderate severely reduced LVEF (30-35%).  We have discussed further evaluation options and have agreed to proceed with left heart catheterization and coronary angiography. I have reviewed the risks, indications, and alternatives to cardiac catheterization, possible angioplasty, and stenting with the patient. Risks include but are not limited to bleeding, infection, vascular injury, stroke, myocardial infection, arrhythmia, kidney injury, radiation-related injury in the case of prolonged fluoroscopy use, emergency cardiac surgery, and death. The patient understands the risks of serious complication is 1-2 in 0156 with diagnostic cardiac cath and 1-2% or less with angioplasty/stenting.  We will have him start aspirin 81 mg daily.  We will defer addition of beta-blocker and ACE inhibitor until the catheterization.  Other than chronic exertional dyspnea, he remains asymptomatic.  [name], MD [facility] Pager: [phone]

## 2017-10-16 NOTE — Telephone Encounter (Signed)
LPM to call me and arrange a LHC.Marland Kitchen

## 2017-10-16 NOTE — Progress Notes (Signed)
Spoke with patient and set up [place]

## 2017-10-16 NOTE — Telephone Encounter (Signed)
Called patient and set up Left Heart Cath..  He verbalized understanding and will pick up instructions when he has labs drawn.

## 2017-10-16 NOTE — Telephone Encounter (Signed)
New Message:      Pt calling to reschedule a procedure

## 2017-10-18 ENCOUNTER — Telehealth: Payer: Self-pay | Admitting: Internal Medicine

## 2017-10-18 ENCOUNTER — Other Ambulatory Visit: Payer: Medicare Other | Admitting: *Deleted

## 2017-10-18 DIAGNOSIS — Z01812 Encounter for preprocedural laboratory examination: Secondary | ICD-10-CM

## 2017-10-18 LAB — CBC
Hematocrit: 44.5 % (ref 37.5–51.0)
Hemoglobin: 15.5 g/dL (ref 13.0–17.7)
MCH: 32.8 pg (ref 26.6–33.0)
MCHC: 34.8 g/dL (ref 31.5–35.7)
MCV: 94 fL (ref 79–97)
Platelets: 196 10*3/uL (ref 150–379)
RBC: 4.72 x10E6/uL (ref 4.14–5.80)
RDW: 13.5 % (ref 12.3–15.4)
WBC: 4.8 10*3/uL (ref 3.4–10.8)

## 2017-10-18 LAB — BASIC METABOLIC PANEL
BUN/Creatinine Ratio: 16 (ref 10–24)
BUN: 16 mg/dL (ref 8–27)
CO2: 20 mmol/L (ref 20–29)
Calcium: 9.3 mg/dL (ref 8.6–10.2)
Chloride: 106 mmol/L (ref 96–106)
Creatinine, Ser: 0.99 mg/dL (ref 0.76–1.27)
GFR calc Af Amer: 89 mL/min/{1.73_m2} (ref >59)
GFR calc non Af Amer: 77 mL/min/{1.73_m2} (ref >59)
Glucose: 90 mg/dL (ref 65–99)
Potassium: 4.3 mmol/L (ref 3.5–5.2)
Sodium: 141 mmol/L (ref 134–144)

## 2017-10-18 LAB — PROTIME-INR
INR: 1.1 (ref 0.8–1.2)
Prothrombin Time: 11.1 s (ref 9.1–12.0)

## 2017-10-18 NOTE — Telephone Encounter (Signed)
New message    Pt has a question about his echo from the other day. He would like to know the number.

## 2017-10-18 NOTE — Telephone Encounter (Signed)
Spoke with patient and informed him of EF %...  He verbalized understanding.Marland Kitchen

## 2017-10-19 ENCOUNTER — Other Ambulatory Visit: Payer: Self-pay | Admitting: Internal Medicine

## 2017-10-19 DIAGNOSIS — R0600 Dyspnea, unspecified: Secondary | ICD-10-CM

## 2017-10-19 DIAGNOSIS — I429 Cardiomyopathy, unspecified: Secondary | ICD-10-CM

## 2017-10-19 DIAGNOSIS — R0609 Other forms of dyspnea: Secondary | ICD-10-CM

## 2017-10-22 ENCOUNTER — Telehealth: Payer: Self-pay | Admitting: *Deleted

## 2017-10-22 NOTE — Telephone Encounter (Signed)
Pt contacted pre-catheterization scheduled at [facility] for: Thursday October 24, 2017 7:30 AM Verified arrival time and place: [place] Entrance A/[place] at: 5:30 AM No solid food after midnight prior to cath, clear liquids until 5 AM. Verified no known allergies. Verified no diabetes medications.  Hold: Viagra 24 hours pre procedure  Except hold medications AM meds can be  taken pre-cath with sip of water including:  ASA 81 mg  Confirmed patient has responsible person to drive home post procedure and observe patient for 24 hours: yes

## 2017-10-24 ENCOUNTER — Ambulatory Visit (HOSPITAL_COMMUNITY)
Admission: RE | Disposition: A | Discharge status: Home / Self Care | Payer: Self-pay | Source: Ambulatory Visit | Attending: Internal Medicine

## 2017-10-24 ENCOUNTER — Other Ambulatory Visit: Payer: Medicare Other

## 2017-10-24 ENCOUNTER — Ambulatory Visit (HOSPITAL_COMMUNITY)
Admission: RE | Admit: 2017-10-24 | Discharge: 2017-10-24 | Disposition: A | Discharge status: Home / Self Care | Payer: Medicare Other | Source: Ambulatory Visit | Attending: Internal Medicine | Admitting: Internal Medicine

## 2017-10-24 DIAGNOSIS — I429 Cardiomyopathy, unspecified: Secondary | ICD-10-CM | POA: Diagnosis not present

## 2017-10-24 DIAGNOSIS — Z86010 Personal history of colonic polyps: Secondary | ICD-10-CM | POA: Insufficient documentation

## 2017-10-24 DIAGNOSIS — Z9889 Other specified postprocedural states: Secondary | ICD-10-CM | POA: Diagnosis not present

## 2017-10-24 DIAGNOSIS — M1990 Unspecified osteoarthritis, unspecified site: Secondary | ICD-10-CM | POA: Diagnosis not present

## 2017-10-24 DIAGNOSIS — Z87891 Personal history of nicotine dependence: Secondary | ICD-10-CM | POA: Diagnosis not present

## 2017-10-24 DIAGNOSIS — I2510 Atherosclerotic heart disease of native coronary artery without angina pectoris: Secondary | ICD-10-CM

## 2017-10-24 DIAGNOSIS — R0609 Other forms of dyspnea: Secondary | ICD-10-CM

## 2017-10-24 DIAGNOSIS — Z8249 Family history of ischemic heart disease and other diseases of the circulatory system: Secondary | ICD-10-CM | POA: Diagnosis not present

## 2017-10-24 DIAGNOSIS — R9431 Abnormal electrocardiogram [ECG] [EKG]: Secondary | ICD-10-CM | POA: Diagnosis not present

## 2017-10-24 DIAGNOSIS — Z806 Family history of leukemia: Secondary | ICD-10-CM | POA: Insufficient documentation

## 2017-10-24 DIAGNOSIS — N529 Male erectile dysfunction, unspecified: Secondary | ICD-10-CM | POA: Insufficient documentation

## 2017-10-24 DIAGNOSIS — Z85828 Personal history of other malignant neoplasm of skin: Secondary | ICD-10-CM | POA: Diagnosis not present

## 2017-10-24 DIAGNOSIS — I493 Ventricular premature depolarization: Secondary | ICD-10-CM | POA: Insufficient documentation

## 2017-10-24 DIAGNOSIS — I428 Other cardiomyopathies: Secondary | ICD-10-CM

## 2017-10-24 DIAGNOSIS — R0600 Dyspnea, unspecified: Secondary | ICD-10-CM

## 2017-10-24 HISTORY — PX: LEFT HEART CATH AND CORONARY ANGIOGRAPHY: CATH118249

## 2017-10-24 SURGERY — LEFT HEART CATH AND CORONARY ANGIOGRAPHY
Anesthesia: LOCAL

## 2017-10-24 MED ORDER — HEPARIN SODIUM (PORCINE) 1000 UNIT/ML IJ SOLN
INTRAMUSCULAR | Status: AC
  Filled 2017-10-24: qty 1

## 2017-10-24 MED ORDER — ONDANSETRON HCL 4 MG/2ML IJ SOLN: 4.0000 mg | Freq: Four times a day (QID) | INTRAMUSCULAR | Status: DC | PRN

## 2017-10-24 MED ORDER — SODIUM CHLORIDE 0.9 % IV SOLN: 250.0000 mL | INTRAVENOUS | Status: DC | PRN

## 2017-10-24 MED ORDER — LIDOCAINE HCL (PF) 1 % IJ SOLN
INTRAMUSCULAR | Status: AC
  Filled 2017-10-24: qty 30

## 2017-10-24 MED ORDER — SODIUM CHLORIDE 0.9% FLUSH: 3.0000 mL | Freq: Two times a day (BID) | INTRAVENOUS | Status: DC

## 2017-10-24 MED ORDER — SODIUM CHLORIDE 0.9 % IV SOLN
INTRAVENOUS | Status: DC
  Administered 2017-10-24: 06:00:00 via INTRAVENOUS

## 2017-10-24 MED ORDER — MIDAZOLAM HCL 2 MG/2ML IJ SOLN
INTRAMUSCULAR | Status: AC
  Filled 2017-10-24: qty 2

## 2017-10-24 MED ORDER — HEPARIN (PORCINE) IN NACL 2-0.9 UNITS/ML
INTRAMUSCULAR | Status: AC | PRN
  Administered 2017-10-24 (×2): 500 mL

## 2017-10-24 MED ORDER — CARVEDILOL 3.125 MG PO TABS: 3.1250 mg | ORAL_TABLET | Freq: Two times a day (BID) | ORAL | 11 refills | Status: DC

## 2017-10-24 MED ORDER — ACETAMINOPHEN 325 MG PO TABS: 650.0000 mg | ORAL_TABLET | ORAL | Status: DC | PRN

## 2017-10-24 MED ORDER — FENTANYL CITRATE (PF) 100 MCG/2ML IJ SOLN
INTRAMUSCULAR | Status: DC | PRN
  Administered 2017-10-24: 50 ug via INTRAVENOUS

## 2017-10-24 MED ORDER — HEPARIN SODIUM (PORCINE) 1000 UNIT/ML IJ SOLN
INTRAMUSCULAR | Status: DC | PRN
  Administered 2017-10-24: 5000 [IU] via INTRAVENOUS

## 2017-10-24 MED ORDER — SODIUM CHLORIDE 0.9 % IV SOLN: INTRAVENOUS | Status: DC

## 2017-10-24 MED ORDER — SODIUM CHLORIDE 0.9% FLUSH: 3.0000 mL | INTRAVENOUS | Status: DC | PRN

## 2017-10-24 MED ORDER — ASPIRIN 81 MG PO CHEW: 81.0000 mg | CHEWABLE_TABLET | ORAL | Status: DC

## 2017-10-24 MED ORDER — MIDAZOLAM HCL 2 MG/2ML IJ SOLN
INTRAMUSCULAR | Status: DC | PRN
  Administered 2017-10-24 (×2): 1 mg via INTRAVENOUS

## 2017-10-24 MED ORDER — IOHEXOL 350 MG/ML SOLN
INTRAVENOUS | Status: DC | PRN
  Administered 2017-10-24: 60 mL

## 2017-10-24 MED ORDER — HEPARIN (PORCINE) IN NACL 1000-0.9 UT/500ML-% IV SOLN
INTRAVENOUS | Status: AC
  Filled 2017-10-24: qty 1000

## 2017-10-24 MED ORDER — VERAPAMIL HCL 2.5 MG/ML IV SOLN
INTRAVENOUS | Status: AC
  Filled 2017-10-24: qty 2

## 2017-10-24 MED ORDER — VERAPAMIL HCL 2.5 MG/ML IV SOLN
INTRAVENOUS | Status: DC | PRN
  Administered 2017-10-24: 10 mL via INTRA_ARTERIAL

## 2017-10-24 MED ORDER — LIDOCAINE HCL (PF) 1 % IJ SOLN
INTRAMUSCULAR | Status: DC | PRN
  Administered 2017-10-24: 2 mL

## 2017-10-24 MED ORDER — FENTANYL CITRATE (PF) 100 MCG/2ML IJ SOLN
INTRAMUSCULAR | Status: AC
  Filled 2017-10-24: qty 2

## 2017-10-24 SURGICAL SUPPLY — 11 items
BAND CMPR LRG ZPHR (HEMOSTASIS) ×1
BAND ZEPHYR COMPRESS 30 LONG (HEMOSTASIS) ×1 IMPLANT
CATH INFINITI 5 FR JL3.5 (CATHETERS) ×1 IMPLANT
CATH INFINITI JR4 5F (CATHETERS) ×1 IMPLANT
GLIDESHEATH SLEND SS 6F .021 (SHEATH) ×1 IMPLANT
GUIDEWIRE INQWIRE 1.5J.035X260 (WIRE) IMPLANT
INQWIRE 1.5J .035X260CM (WIRE) ×2
KIT HEART LEFT (KITS) ×2 IMPLANT
PACK CARDIAC CATHETERIZATION (CUSTOM PROCEDURE TRAY) ×2 IMPLANT
TRANSDUCER W/STOPCOCK (MISCELLANEOUS) ×2 IMPLANT
TUBING CIL FLEX 10 FLL-RA (TUBING) ×2 IMPLANT

## 2017-10-24 NOTE — Discharge Instructions (Signed)

## 2017-10-24 NOTE — Brief Op Note (Signed)
BRIEF CARDIAC CATHETERIZATION NOTE  DATE: 10/24/2017 TIME: 8:06 AM  PATIENT:  Frederick Goodwin  72 y.o. male  PRE-OPERATIVE DIAGNOSIS: Cardiomyopathy and PVC's  POST-OPERATIVE DIAGNOSIS:  Non-ischemic cardiomyopathy and non-obstructive coronary artery disease  PROCEDURE:  Procedure(s): LEFT HEART CATH AND CORONARY ANGIOGRAPHY (N/A)  SURGEON:  Surgeon(s) and Role:    * Jasara Corrigan, MD - Primary  FINDINGS: 1. Mild to moderate, non-obstructive coronary artery disease. 2. Mildly elevated LVEDP (17 mmHg).  RECOMMENDATIONS: 1. Medical therapy for NICM and non-obstructive CAD.  [name], MD [facility] Pager: [phone]

## 2017-10-24 NOTE — Interval H&P Note (Signed)
History and Physical Interval Note:  10/24/2017 7:22 AM  Frederick Goodwin  has presented today for cardiac catheterization, with the diagnosis of cardiomyopathy.  The various methods of treatment have been discussed with the patient and family. After consideration of risks, benefits and other options for treatment, the patient has consented to  Procedure(s): LEFT HEART CATH AND CORONARY ANGIOGRAPHY (N/A) as a surgical intervention .  The patient's history has been reviewed, patient examined, no change in status, stable for surgery.  I have reviewed the patient's chart and labs.  Questions were answered to the patient's satisfaction.    Cath Lab Visit (complete for each Cath Lab visit)  Clinical Evaluation Leading to the Procedure:   ACS: No.  Non-ACS:    Anginal Classification: CCS II - shortness of breath  Anti-ischemic medical therapy: No Therapy  Non-Invasive Test Results: No non-invasive testing performed (severely reduced LVEF by echo - 30-35%)  Prior CABG: No previous CABG  Frederick Goodwin

## 2017-10-25 ENCOUNTER — Other Ambulatory Visit: Payer: Self-pay

## 2017-10-25 ENCOUNTER — Other Ambulatory Visit: Payer: Medicare Other

## 2017-10-25 ENCOUNTER — Encounter (HOSPITAL_COMMUNITY): Payer: Self-pay | Admitting: Internal Medicine

## 2017-10-25 DIAGNOSIS — I429 Cardiomyopathy, unspecified: Secondary | ICD-10-CM

## 2017-10-25 DIAGNOSIS — I493 Ventricular premature depolarization: Secondary | ICD-10-CM

## 2017-10-25 DIAGNOSIS — E785 Hyperlipidemia, unspecified: Secondary | ICD-10-CM

## 2017-10-25 MED FILL — Heparin Sod (Porcine)-NaCl IV Soln 1000 Unit/500ML-0.9%: INTRAVENOUS | Qty: 1000 | Status: AC

## 2017-11-04 ENCOUNTER — Other Ambulatory Visit: Payer: Medicare Other | Admitting: *Deleted

## 2017-11-04 ENCOUNTER — Ambulatory Visit: Payer: Medicare Other | Admitting: Internal Medicine

## 2017-11-04 ENCOUNTER — Encounter: Payer: Self-pay | Admitting: Internal Medicine

## 2017-11-04 VITALS — BP 104/60 | HR 82 | Ht 72.0 in | Wt 212.4 lb

## 2017-11-04 DIAGNOSIS — I493 Ventricular premature depolarization: Secondary | ICD-10-CM | POA: Diagnosis not present

## 2017-11-04 DIAGNOSIS — I2510 Atherosclerotic heart disease of native coronary artery without angina pectoris: Secondary | ICD-10-CM

## 2017-11-04 DIAGNOSIS — I428 Other cardiomyopathies: Secondary | ICD-10-CM | POA: Diagnosis not present

## 2017-11-04 DIAGNOSIS — I429 Cardiomyopathy, unspecified: Secondary | ICD-10-CM

## 2017-11-04 DIAGNOSIS — E785 Hyperlipidemia, unspecified: Secondary | ICD-10-CM | POA: Diagnosis not present

## 2017-11-04 LAB — LIPID PANEL
Chol/HDL Ratio: 3.8 ratio (ref 0.0–5.0)
Cholesterol, Total: 189 mg/dL (ref 100–199)
HDL: 50 mg/dL (ref >39)
LDL Calculated: 124 mg/dL — ABNORMAL HIGH (ref 0–99)
Triglycerides: 73 mg/dL (ref 0–149)
VLDL Cholesterol Cal: 15 mg/dL (ref 5–40)

## 2017-11-04 LAB — ALT: ALT: 22 IU/L (ref 0–44)

## 2017-11-04 NOTE — Progress Notes (Signed)
Follow-up Outpatient Visit Date: 11/04/2017  Primary Care Provider: Orpah Melter, MD [address] [place] [address]  Chief Complaint: Follow-up PVCs and cardiomyopathy  HPI:  Frederick Goodwin is a 72 y.o. year-old male with history of recently diagnosed non-ischemic cardiomyopathy in the setting of frequent PVC's, as well as peptic ulcer disease, erectile dysfunction, and skin cancer, who presents for follow-up of cardiomyopathy.  I met Frederick Goodwin a month ago; he was referred for incidentally noted PVC's.  Subsequent echo showed LVEF of 30-35%; catheterization demonstrated moderate, non-obstructive CAD consistent with non-ischemic cardiomyopathy.  We agreed to start carvedilol 3.125 mg BID after the catheterization.  Today, Frederick Goodwin reports feeling relatively well.  He wonders if he has been feeling some fatigue over the last few weeks, though he is still able to do all of his usual activities.  He will need to stop and rest from time to time when doing strenuous activities.  He has not had any noticeable side effects from the addition of carvedilol.  He denies chest pain, shortness of breath, palpitations, lightheadedness, orthopnea, PND, and edema.  He has not had any problems with his right radial catheterization site.  --------------------------------------------------------------------------------------------------  Cardiovascular History & Procedures: Cardiovascular Problems:  PVCs  NICM  Risk Factors:  Age greater than 74 and male gender  Cath/PCI:  LHC (10/24/17): LMCA normal.  Proximal LAD with 40% stenosis.  OM1 with 50% stenosis.  LCx otherwise normal.  Minimal diffuse disease involving RCA.  LVEDP 17 mmHg.  No aortic stenosis.  CV Surgery:  None  EP Procedures and Devices:  None  Non-Invasive Evaluation(s):  TTE (10/15/17): Moderately dilated LV with normal wall thickness.  LVEF 30-35% with diffuse hypokinesis.  Elevated filling pressure noted.   MAC with mild MR.  Mild LAE.  Recent CV Pertinent Labs: Lab Results  Component Value Date   INR 1.1 10/18/2017   K 4.3 10/18/2017   MG 2.3 10/07/2017   BUN 16 10/18/2017   CREATININE 0.99 10/18/2017    Past medical and surgical history were reviewed and updated in EPIC.  Current Meds  Medication Sig  . aspirin EC 81 MG tablet Take 1 tablet (81 mg total) by mouth daily.  . carvedilol (COREG) 3.125 MG tablet Take 1 tablet (3.125 mg total) by mouth 2 (two) times daily.  . fexofenadine (ALLEGRA) 180 MG tablet Take 180 mg by mouth daily as needed for allergies.   . Multiple Vitamin (MULTIVITAMIN) tablet Take 1 tablet by mouth daily.  . sildenafil (VIAGRA) 100 MG tablet Take 100 mg by mouth as needed for erectile dysfunction.     Allergies: Patient has no known allergies.  Social History   Tobacco Use  . Smoking status: Former Smoker    Packs/day: 1.00    Years: 30.00    Pack years: 30.00    Types: Cigarettes    Last attempt to quit: 1999    Years since quitting: 20.3  . Smokeless tobacco: Never Used  Substance Use Topics  . Alcohol use: Yes    Alcohol/week: 4.8 oz    Types: 1 Standard drinks or equivalent, 7 Glasses of wine per week  . Drug use: No    Family History  Problem Relation Age of Onset  . Heart disease Mother        Atrial fibrillation  . COPD Mother   . Cancer Father        leukemia    Review of Systems: A 12-system review of  systems was performed and was negative except as noted in the HPI.  --------------------------------------------------------------------------------------------------  Physical Exam: BP 104/60   Pulse 82   Ht 6' (1.829 m)   Wt 212 lb 6.4 oz (96.3 kg)   BMI 28.81 kg/m  Repeat BP 112/70  General: NAD. HEENT: No conjunctival pallor or scleral icterus. Moist mucous membranes.  OP clear. Neck: Supple without lymphadenopathy, thyromegaly, JVD, or HJR. Lungs: Normal work of breathing. Clear to auscultation bilaterally without  wheezes or crackles. Heart: Regular rate and rhythm rare extrasystoles.  No murmurs, rubs, or gallops. Non-displaced PMI. Abd: Bowel sounds present. Soft, NT/ND without hepatosplenomegaly Ext: No lower extremity edema. Radial, PT, and DP pulses are 2+ bilaterally.  Right radial arteriotomy site is well-healed with minimal bruising. Skin: Warm and dry without rash.  Lab Results  Component Value Date   WBC 4.8 10/18/2017   HGB 15.5 10/18/2017   HCT 44.5 10/18/2017   MCV 94 10/18/2017   PLT 196 10/18/2017    Lab Results  Component Value Date   NA 141 10/18/2017   K 4.3 10/18/2017   CL 106 10/18/2017   CO2 20 10/18/2017   BUN 16 10/18/2017   CREATININE 0.99 10/18/2017   GLUCOSE 90 10/18/2017    No results found for: CHOL, HDL, LDLCALC, LDLDIRECT, TRIG, CHOLHDL  --------------------------------------------------------------------------------------------------  ASSESSMENT AND PLAN: Nonischemic cardiomyopathy Frederick Goodwin continues to do well with NYHA class I-II symptoms.  Since our last visit, he expresses some occasional fatigue.  It is possible this could be due to the addition of carvedilol, though I am hesitant to stop the medication.  We discussed adding an ACE inhibitor or ARB, but have agreed to defer this given borderline low blood pressure.  This will need to be readdressed when he returns for follow-up.  PVC's Rare extrasystoles noted on exam.  Mr. Beier remains asymptomatic.  We will continue with carvedilol 3.125 mg twice daily.  Coronary artery disease without angina Mild to moderate, nonobstructive CAD noted by cath.  Lipid panel is pending today.  We will discuss addition of a statin based on this in order to prevent progression of CAD.  Follow-up: Return to clinic in 1 month.  Frederick Bush, MD 11/04/2017 11:57 AM

## 2017-11-04 NOTE — Patient Instructions (Addendum)
Medication Instructions:  Your physician recommends that you continue on your current medications as directed. Please refer to the Current Medication list given to you today.  -- If you need a refill on your cardiac medications before your next appointment, please call your pharmacy. --  Labwork: None ordered  Testing/Procedures: None ordered  Follow-Up:  Your physician wants you to follow-up in: 1 month with Dr. Saunders Revel.     Thank you for choosing [facility]!!    Any Other Special Instructions Will Be Listed Below (If Applicable).

## 2017-11-05 ENCOUNTER — Encounter: Payer: Self-pay | Admitting: Internal Medicine

## 2017-11-05 DIAGNOSIS — I2510 Atherosclerotic heart disease of native coronary artery without angina pectoris: Secondary | ICD-10-CM | POA: Insufficient documentation

## 2017-11-06 ENCOUNTER — Other Ambulatory Visit: Payer: Self-pay

## 2017-11-06 DIAGNOSIS — E785 Hyperlipidemia, unspecified: Secondary | ICD-10-CM

## 2017-11-06 MED ORDER — ATORVASTATIN CALCIUM 10 MG PO TABS: 10.0000 mg | ORAL_TABLET | Freq: Every day | ORAL | 3 refills | Status: DC

## 2017-12-11 NOTE — Progress Notes (Signed)
Follow-up Outpatient Visit  Date: 12/12/2017  Primary Care Provider: Orpah Melter, MD [address] [place] [address]  Chief Complaint: Follow-up PVCs and NICM  HPI:  Frederick Goodwin is a 72 y.o. year-old male with history of nonischemic cardiomyopathy in the setting of frequent PVCs, peptic ulcer disease, erectile dysfunction, and skin cancer, who presents for follow-up of asymptomatic PVCs and nonischemic cardiomyopathy.  I last saw Frederick Goodwin in early May following cardiac catheterization that showed nonobstructive CAD in the setting of an LVEF of 30 to 35%.  He was previously started on low-dose carvedilol, which was continued.  Atorvastatin 10 mg daily was added due to LDL of 124.  Today, Frederick Goodwin reports feeling well.   He denies chest pain, shortness of breath, palpitations, lightheadedness, orthopnea, and PND.  Home BP readings have been similar to today's measurement in the office.  He notes occasional muscle soreness, which he attributes to being more active.  He is walking 3 miles/day (18-minute mile pace).  He is tolerating carvedilol and atorvastatin well.  --------------------------------------------------------------------------------------------------  Cardiovascular History & Procedures: Cardiovascular Problems:  PVCs  NICM  Risk Factors:  Age greater than 4 and male gender  Cath/PCI:  LHC (10/24/17): LMCA normal.  Proximal LAD with 40% stenosis.  OM1 with 50% stenosis.  LCx otherwise normal.  Minimal diffuse disease involving RCA.  LVEDP 17 mmHg.  No aortic stenosis.  CV Surgery:  None  EP Procedures and Devices:  None  Non-Invasive Evaluation(s):  TTE (10/15/17): Moderately dilated LV with normal wall thickness.  LVEF 30-35% with diffuse hypokinesis.  Elevated filling pressure noted.  MAC with mild MR.  Mild LAE.   Recent CV Pertinent Labs: Lab Results  Component Value Date   CHOL 189 11/04/2017   HDL 50 11/04/2017   LDLCALC 124  (H) 11/04/2017   TRIG 73 11/04/2017   CHOLHDL 3.8 11/04/2017   INR 1.1 10/18/2017   K 4.3 10/18/2017   MG 2.3 10/07/2017   BUN 16 10/18/2017   CREATININE 0.99 10/18/2017    Past medical and surgical history were reviewed and updated in EPIC.  Current Meds  Medication Sig  . aspirin EC 81 MG tablet Take 1 tablet (81 mg total) by mouth daily.  Marland Kitchen atorvastatin (LIPITOR) 10 MG tablet Take 1 tablet (10 mg total) by mouth daily.  . carvedilol (COREG) 3.125 MG tablet Take 1 tablet (3.125 mg total) by mouth 2 (two) times daily.  . Coenzyme Q10 (CO Q-10) 100 MG CAPS Take 1 capsule by mouth daily.  . fexofenadine (ALLEGRA) 180 MG tablet Take 180 mg by mouth daily as needed for allergies.   . Multiple Vitamin (MULTIVITAMIN) tablet Take 1 tablet by mouth daily.  . sildenafil (VIAGRA) 100 MG tablet Take 100 mg by mouth as needed for erectile dysfunction.     Allergies: Patient has no known allergies.  Social History   Tobacco Use  . Smoking status: Former Smoker    Packs/day: 1.00    Years: 30.00    Pack years: 30.00    Types: Cigarettes    Last attempt to quit: 1999    Years since quitting: 20.4  . Smokeless tobacco: Never Used  Substance Use Topics  . Alcohol use: Yes    Alcohol/week: 4.8 oz    Types: 1 Standard drinks or equivalent, 7 Glasses of wine per week  . Drug use: No    Family History  Problem Relation Age of Onset  . Heart disease Mother  Atrial fibrillation  . COPD Mother   . Cancer Father        leukemia    Review of Systems: A 12-system review of systems was performed and was negative except as noted in the HPI.  --------------------------------------------------------------------------------------------------  Physical Exam: BP 132/78   Pulse 68   Ht 6' (1.829 m)   Wt 216 lb (98 kg)   SpO2 99%   BMI 29.29 kg/m   General:  NAD. HEENT: No conjunctival pallor or scleral icterus. Moist mucous membranes.  OP clear. Neck: Supple without  lymphadenopathy, thyromegaly, JVD, or HJR. Lungs: Normal work of breathing. Clear to auscultation bilaterally without wheezes or crackles. Heart: Regular rate and rhythm without murmurs, rubs, or gallops. Non-displaced PMI. Abd: Bowel sounds present. Soft, NT/ND without hepatosplenomegaly Ext: No lower extremity edema. Skin: Warm and dry without rash.  EKG: NSR with left axis deviation and nonspecific intraventricular conduction delay.  Previously noted PVC is no longer present.  Otherwise, no significant change.  Lab Results  Component Value Date   WBC 4.8 10/18/2017   HGB 15.5 10/18/2017   HCT 44.5 10/18/2017   MCV 94 10/18/2017   PLT 196 10/18/2017    Lab Results  Component Value Date   NA 141 10/18/2017   K 4.3 10/18/2017   CL 106 10/18/2017   CO2 20 10/18/2017   BUN 16 10/18/2017   CREATININE 0.99 10/18/2017   GLUCOSE 90 10/18/2017   ALT 22 11/04/2017    Lab Results  Component Value Date   CHOL 189 11/04/2017   HDL 50 11/04/2017   LDLCALC 124 (H) 11/04/2017   TRIG 73 11/04/2017   CHOLHDL 3.8 11/04/2017    --------------------------------------------------------------------------------------------------  ASSESSMENT AND PLAN: Non-ischemic cardiomyopathy Frederick Goodwin appears euvolemic and well-compensated with NYHA class I HF symptoms.  BP is upper normal today on carvedilol 3.125 mg BID.  We will start lisinopril 5 mg daily with a BMP today and again in ~2 weeks.  No changes to carvedilol at this time.  We will plan to repeat an echo shortly before his follow-up visit with me in 3 months.  Coronary artery disease without angina No chest pain or other symptoms to suggest worsening coronary insufficiency.  Moderate, non-obstructive CAD was noted on cath earlier this year.  We will continue with ASA and atorvastatin for medical therapy.  We will recheck a lipid panel and ALT when he returns for labs in ~2 weeks.  PVC's No palpitations.  EKG is without PVC's today.   Continue carvedilol with repeat echo in ~3 months.  If LVEF is still low, we will need to consider Holter monitor placement to assess PVC burden, in case this represents a PVC-induced cardiomyopathy.  Follow-up: Return to clinic in 3 months.  [name], MD 12/12/2017 8:45 AM

## 2017-12-12 ENCOUNTER — Encounter: Payer: Self-pay | Admitting: Internal Medicine

## 2017-12-12 ENCOUNTER — Encounter (INDEPENDENT_AMBULATORY_CARE_PROVIDER_SITE_OTHER): Payer: Self-pay

## 2017-12-12 ENCOUNTER — Ambulatory Visit: Payer: Medicare Other | Admitting: Internal Medicine

## 2017-12-12 VITALS — BP 132/78 | HR 68 | Ht 72.0 in | Wt 216.0 lb

## 2017-12-12 DIAGNOSIS — I2510 Atherosclerotic heart disease of native coronary artery without angina pectoris: Secondary | ICD-10-CM | POA: Diagnosis not present

## 2017-12-12 DIAGNOSIS — I428 Other cardiomyopathies: Secondary | ICD-10-CM | POA: Diagnosis not present

## 2017-12-12 DIAGNOSIS — I493 Ventricular premature depolarization: Secondary | ICD-10-CM

## 2017-12-12 LAB — BASIC METABOLIC PANEL
BUN/Creatinine Ratio: 12 (ref 10–24)
BUN: 12 mg/dL (ref 8–27)
CO2: 21 mmol/L (ref 20–29)
Calcium: 9.5 mg/dL (ref 8.6–10.2)
Chloride: 102 mmol/L (ref 96–106)
Creatinine, Ser: 1.02 mg/dL (ref 0.76–1.27)
GFR calc Af Amer: 86 mL/min/{1.73_m2} (ref >59)
GFR calc non Af Amer: 74 mL/min/{1.73_m2} (ref >59)
Glucose: 97 mg/dL (ref 65–99)
Potassium: 4.9 mmol/L (ref 3.5–5.2)
Sodium: 137 mmol/L (ref 134–144)

## 2017-12-12 MED ORDER — LISINOPRIL 5 MG PO TABS: 5.0000 mg | ORAL_TABLET | Freq: Every day | ORAL | 3 refills | Status: DC

## 2017-12-12 NOTE — Patient Instructions (Addendum)
Medication Instructions:  START Lisinopril 5 mg daily  -- If you need a refill on your cardiac medications before your next appointment, please call your pharmacy. --  Labwork: BMP Today  BMP/ALT/FASTING LIPID IN 2 WEEKS  Testing/Procedures: TO BE DONE IN 2 MONTHS  Your physician has requested that you have an echocardiogram. Echocardiography is a painless test that uses sound waves to create images of your heart. It provides your doctor with information about the size and shape of your heart and how well your heart's chambers and valves are working. This procedure takes approximately one hour. There are no restrictions for this procedure.   Follow-Up: Your physician wants you to follow-up in: 3 MONTHS with Dr. Saunders Revel.      Thank you for choosing [facility]!!    Any Other Special Instructions Will Be Listed Below (If Applicable).

## 2017-12-31 ENCOUNTER — Other Ambulatory Visit: Payer: Medicare Other | Admitting: *Deleted

## 2017-12-31 DIAGNOSIS — I428 Other cardiomyopathies: Secondary | ICD-10-CM

## 2017-12-31 DIAGNOSIS — I493 Ventricular premature depolarization: Secondary | ICD-10-CM | POA: Diagnosis not present

## 2017-12-31 LAB — BASIC METABOLIC PANEL
BUN/Creatinine Ratio: 17 (ref 10–24)
BUN: 17 mg/dL (ref 8–27)
CO2: 20 mmol/L (ref 20–29)
Calcium: 10.7 mg/dL — ABNORMAL HIGH (ref 8.6–10.2)
Chloride: 105 mmol/L (ref 96–106)
Creatinine, Ser: 1 mg/dL (ref 0.76–1.27)
GFR calc Af Amer: 88 mL/min/{1.73_m2} (ref >59)
GFR calc non Af Amer: 76 mL/min/{1.73_m2} (ref >59)
Glucose: 94 mg/dL (ref 65–99)
Potassium: 4.9 mmol/L (ref 3.5–5.2)
Sodium: 140 mmol/L (ref 134–144)

## 2017-12-31 LAB — LIPID PANEL
Chol/HDL Ratio: 2.7 ratio (ref 0.0–5.0)
Cholesterol, Total: 171 mg/dL (ref 100–199)
HDL: 63 mg/dL (ref >39)
LDL Calculated: 98 mg/dL (ref 0–99)
Triglycerides: 52 mg/dL (ref 0–149)
VLDL Cholesterol Cal: 10 mg/dL (ref 5–40)

## 2017-12-31 LAB — ALT: ALT: 19 IU/L (ref 0–44)

## 2018-01-15 DIAGNOSIS — Z0000 Encounter for general adult medical examination without abnormal findings: Secondary | ICD-10-CM | POA: Diagnosis not present

## 2018-02-03 ENCOUNTER — Telehealth: Payer: Self-pay

## 2018-02-03 ENCOUNTER — Encounter: Payer: Self-pay | Admitting: Internal Medicine

## 2018-02-03 NOTE — Telephone Encounter (Signed)
Telephoned patient and left him a message that he does not need to come in 8/8 for lab draw.  I told him to call if he has any questions.

## 2018-02-06 ENCOUNTER — Other Ambulatory Visit: Payer: Medicare Other

## 2018-02-06 DIAGNOSIS — H2513 Age-related nuclear cataract, bilateral: Secondary | ICD-10-CM | POA: Diagnosis not present

## 2018-02-06 DIAGNOSIS — H40033 Anatomical narrow angle, bilateral: Secondary | ICD-10-CM | POA: Diagnosis not present

## 2018-02-13 ENCOUNTER — Other Ambulatory Visit: Payer: Self-pay

## 2018-02-13 ENCOUNTER — Ambulatory Visit (HOSPITAL_COMMUNITY): Payer: Medicare Other | Attending: Cardiology

## 2018-02-13 ENCOUNTER — Other Ambulatory Visit (HOSPITAL_COMMUNITY): Payer: Medicare Other

## 2018-02-13 DIAGNOSIS — I429 Cardiomyopathy, unspecified: Secondary | ICD-10-CM | POA: Insufficient documentation

## 2018-02-13 DIAGNOSIS — Z87891 Personal history of nicotine dependence: Secondary | ICD-10-CM | POA: Diagnosis not present

## 2018-02-13 DIAGNOSIS — E785 Hyperlipidemia, unspecified: Secondary | ICD-10-CM | POA: Insufficient documentation

## 2018-02-13 DIAGNOSIS — I119 Hypertensive heart disease without heart failure: Secondary | ICD-10-CM | POA: Insufficient documentation

## 2018-02-13 DIAGNOSIS — I348 Other nonrheumatic mitral valve disorders: Secondary | ICD-10-CM | POA: Diagnosis not present

## 2018-02-13 DIAGNOSIS — I493 Ventricular premature depolarization: Secondary | ICD-10-CM | POA: Diagnosis not present

## 2018-02-13 DIAGNOSIS — I428 Other cardiomyopathies: Secondary | ICD-10-CM | POA: Diagnosis not present

## 2018-02-13 MED ORDER — PERFLUTREN LIPID MICROSPHERE
1.0000 mL | INTRAVENOUS | Status: AC | PRN
  Administered 2018-02-13: 2 mL via INTRAVENOUS

## 2018-02-18 ENCOUNTER — Other Ambulatory Visit: Payer: Self-pay

## 2018-03-30 NOTE — Progress Notes (Signed)
Follow-up Outpatient Visit Date: 03/31/2018  Primary Care Provider: Orpah Melter, MD [address] [place] [address]  Chief Complaint: Follow-up cardiomyopathy, CAD, and PVCs  HPI:  Frederick Goodwin is a 71 y.o. year-old male with history of nonischemic cardiomyopathy in the setting of frequent PVCs, non-obstructive coronary artery disease with moderate proximal LAD stenosis, peptic ulcer disease, erectile dysfunction, and skin cancer, who presents for follow-up of cardiomyopathy and PVC's.  I last saw Frederick Goodwin in June, at which time he continued to do well.  We agreed to start lisinopril 5 mg daily and obtain repeat echo that showed improvement in LVEF from 30-35% to 40-45%.  He contacted [place] after our last visit complaining of back pain.  We agreed to a statin holiday.  Since stopping atorvastatin, Frederick Goodwin reports that his back pain has resolved.  He has otherwise felt well, denying chest pain, shortness of breath, palpitations, lightheadedness, orthopnea, and edema.  He is tolerating low-dose carvedilol and lisinopril well without side effects.  --------------------------------------------------------------------------------------------------  Cardiovascular History & Procedures: Cardiovascular Problems:  PVCs  NICM  Non-obstructive CAD  Risk Factors:  Age greater than 67 and male gender  Cath/PCI:  LHC (10/24/17): LMCA normal. Proximal LAD with 40% stenosis. OM1 with 50% stenosis. LCx otherwise normal. Minimal diffuse disease involving RCA. LVEDP 17 mmHg. No aortic stenosis.  CV Surgery:  None  EP Procedures and Devices:  None  Non-Invasive Evaluation(s):  TTE (10/15/17): Moderately dilated LV with normal wall thickness. LVEF 30-35% with diffuse hypokinesis. Elevated filling pressure noted. MAC with mild MR. Mild LAE.  Recent CV Pertinent Labs: Lab Results  Component Value Date   CHOL 171 12/31/2017   HDL 63 12/31/2017   LDLCALC 98  12/31/2017   TRIG 52 12/31/2017   CHOLHDL 2.7 12/31/2017   INR 1.1 10/18/2017   K 4.9 12/31/2017   MG 2.3 10/07/2017   BUN 17 12/31/2017   CREATININE 1.00 12/31/2017    Past medical and surgical history were reviewed and updated in EPIC.  Current Meds  Medication Sig  . aspirin EC 81 MG tablet Take 1 tablet (81 mg total) by mouth daily.  . Coenzyme Q10 (CO Q-10) 100 MG CAPS Take 1 capsule by mouth daily.  . fexofenadine (ALLEGRA) 180 MG tablet Take 180 mg by mouth daily as needed for allergies.   Marland Kitchen lisinopril (PRINIVIL,ZESTRIL) 5 MG tablet Take 5 mg by mouth daily.  . Multiple Vitamin (MULTIVITAMIN) tablet Take 1 tablet by mouth daily.  . sildenafil (VIAGRA) 100 MG tablet Take 100 mg by mouth as needed for erectile dysfunction.   . [DISCONTINUED] carvedilol (COREG) 3.125 MG tablet Take 1 tablet (3.125 mg total) by mouth 2 (two) times daily.    Allergies: Patient has no known allergies.  Social History   Tobacco Use  . Smoking status: Former Smoker    Packs/day: 1.00    Years: 30.00    Pack years: 30.00    Types: Cigarettes    Last attempt to quit: 1999    Years since quitting: 20.7  . Smokeless tobacco: Never Used  Substance Use Topics  . Alcohol use: Yes    Alcohol/week: 8.0 standard drinks    Types: 1 Standard drinks or equivalent, 7 Glasses of wine per week  . Drug use: No    Family History  Problem Relation Age of Onset  . Heart disease Mother        Atrial fibrillation  . COPD Mother   . Cancer  Father        leukemia    Review of Systems: A 12-system review of systems was performed and was negative except as noted in the HPI.  --------------------------------------------------------------------------------------------------  Physical Exam: BP 126/80   Pulse 72   Ht 6' (1.829 m)   Wt 223 lb 12.8 oz (101.5 kg)   BMI 30.35 kg/m   General: NAD. HEENT: No conjunctival pallor or scleral icterus. Moist mucous membranes.  OP clear. Neck: Supple without  lymphadenopathy, thyromegaly, JVD, or HJR. Lungs: Normal work of breathing. Clear to auscultation bilaterally without wheezes or crackles. Heart: Regular rate and rhythm without murmurs, rubs, or gallops. Non-displaced PMI. Abd: Bowel sounds present. Soft, NT/ND without hepatosplenomegaly Ext: No lower extremity edema. Radial, PT, and DP pulses are 2+ bilaterally. Skin: Warm and dry without rash.  EKG: Normal sinus rhythm with left axis deviation and nonspecific intraventricular conduction delay.  No significant change since 12/12/2017.  Lab Results  Component Value Date   WBC 4.8 10/18/2017   HGB 15.5 10/18/2017   HCT 44.5 10/18/2017   MCV 94 10/18/2017   PLT 196 10/18/2017    Lab Results  Component Value Date   NA 140 12/31/2017   K 4.9 12/31/2017   CL 105 12/31/2017   CO2 20 12/31/2017   BUN 17 12/31/2017   CREATININE 1.00 12/31/2017   GLUCOSE 94 12/31/2017   ALT 19 12/31/2017    Lab Results  Component Value Date   CHOL 171 12/31/2017   HDL 63 12/31/2017   LDLCALC 98 12/31/2017   TRIG 52 12/31/2017   CHOLHDL 2.7 12/31/2017    --------------------------------------------------------------------------------------------------  ASSESSMENT AND PLAN: Nonischemic cardiomyopathy Frederick Goodwin continues to do well.  He appears euvolemic and well compensated on exam today with NYHA class I heart failure symptoms.  We have agreed to increase carvedilol to 6.25 mg twice daily.  We will continue lisinopril 5 mg daily.  Nonobstructive coronary artery disease No symptoms to suggest worsening coronary insufficiency.  We will continue with medical therapy to prevent progression of his moderate LAD disease.  We will retrial him with a statin (rosuvastatin 5 mg daily).  Continue aspirin 81 mg daily.  PVCs No symptoms.  No PVCs on EKG today.  Continue beta-blocker, as above.  Hyperlipidemia Given moderate CAD, LDL goal is less than 70.  Given resolution of back pain with statin  holiday, we have agreed to retry rosuvastatin 5 mg daily.  Lipid panel and ALT should be checked when Frederick Goodwin sees his PCP in December for annual follow-up.  Follow-up: Return to clinic in 6 months in [place].  [name], MD 03/31/2018 1:54 PM

## 2018-03-31 ENCOUNTER — Ambulatory Visit: Payer: Medicare Other | Admitting: Internal Medicine

## 2018-03-31 ENCOUNTER — Encounter: Payer: Self-pay | Admitting: Internal Medicine

## 2018-03-31 VITALS — BP 126/80 | HR 72 | Ht 72.0 in | Wt 223.8 lb

## 2018-03-31 DIAGNOSIS — I428 Other cardiomyopathies: Secondary | ICD-10-CM

## 2018-03-31 DIAGNOSIS — I493 Ventricular premature depolarization: Secondary | ICD-10-CM

## 2018-03-31 DIAGNOSIS — I2510 Atherosclerotic heart disease of native coronary artery without angina pectoris: Secondary | ICD-10-CM | POA: Diagnosis not present

## 2018-03-31 DIAGNOSIS — E785 Hyperlipidemia, unspecified: Secondary | ICD-10-CM | POA: Diagnosis not present

## 2018-03-31 MED ORDER — ROSUVASTATIN CALCIUM 5 MG PO TABS: 5.0000 mg | ORAL_TABLET | Freq: Every day | ORAL | 3 refills | Status: DC

## 2018-03-31 MED ORDER — CARVEDILOL 6.25 MG PO TABS: 6.2500 mg | ORAL_TABLET | Freq: Two times a day (BID) | ORAL | 3 refills | Status: DC

## 2018-03-31 NOTE — Patient Instructions (Signed)
Medication Instructions:  START Rosuvastatin (CRESTOR) 5 mg daily  INCREASE Carvedilol to 6.25 mg twice per day  -- If you need a refill on your cardiac medications before your next appointment, please call your pharmacy. --  Labwork: Please have PCP DRAW (LFT/LIPID PROFILE) in December and have them Fax to [phone]  Testing/Procedures: None ordered  Follow-Up: Your physician wants you to follow-up in: 6 months with Dr. Saunders Revel in [place].  You will receive a reminder letter in the mail two months in advance. If you don't receive a letter, please call our office to schedule the follow-up appointment.  Thank you for choosing [facility]!!    Any Other Special Instructions Will Be Listed Below (If Applicable).

## 2018-04-01 ENCOUNTER — Encounter: Payer: Self-pay | Admitting: Internal Medicine

## 2018-04-23 DIAGNOSIS — Z23 Encounter for immunization: Secondary | ICD-10-CM | POA: Diagnosis not present

## 2018-06-02 DIAGNOSIS — R12 Heartburn: Secondary | ICD-10-CM | POA: Diagnosis not present

## 2018-06-02 DIAGNOSIS — I428 Other cardiomyopathies: Secondary | ICD-10-CM | POA: Diagnosis not present

## 2018-06-02 DIAGNOSIS — J069 Acute upper respiratory infection, unspecified: Secondary | ICD-10-CM | POA: Diagnosis not present

## 2018-06-02 DIAGNOSIS — I2510 Atherosclerotic heart disease of native coronary artery without angina pectoris: Secondary | ICD-10-CM | POA: Diagnosis not present

## 2018-08-30 IMAGING — US US AORTA SCREENING (MEDICARE)
1 series · 14 of 19 positions shown · non-contrast
Comparison: None.

CLINICAL DATA: Patient with family history of AAA.

EXAM:
US ABDOMINAL AORTA MEDICARE SCREENING
TECHNIQUE: Ultrasound examination of the abdominal aorta was performed as a
screening evaluation for abdominal aortic aneurysm.

[Series 1: us aorta screening (medicare) · 0.30mm/px · 14 of 19 slices shown]
[im 1/19]
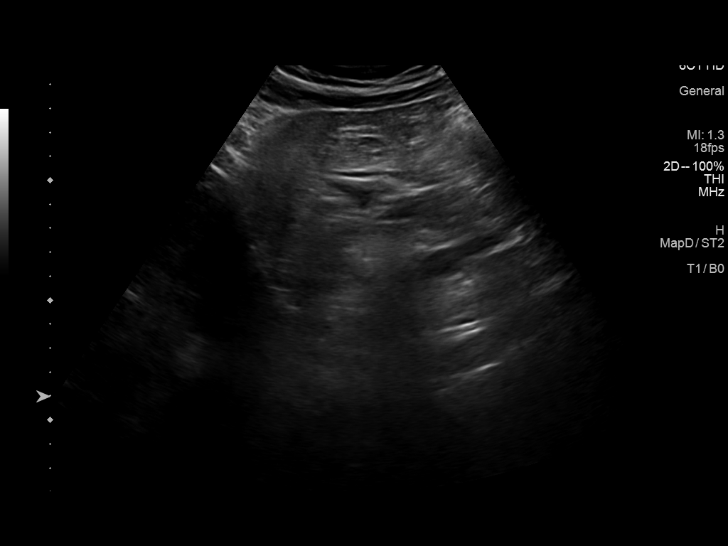
[im 3/19]
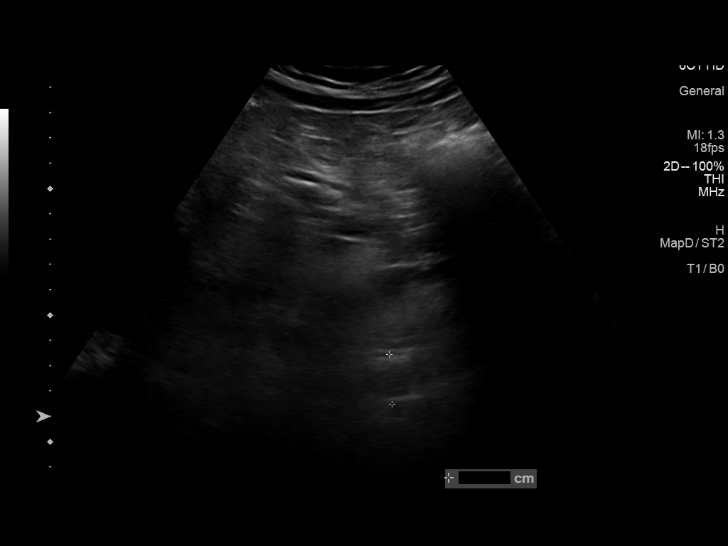
[im 4/19]
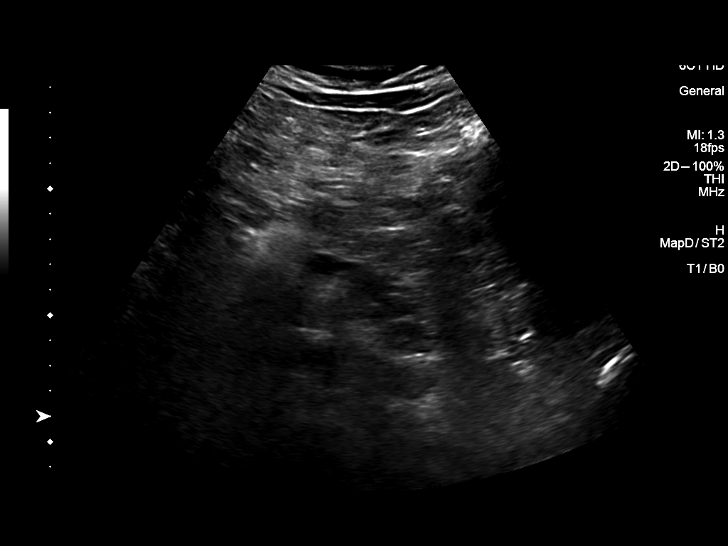
[im 5/19]
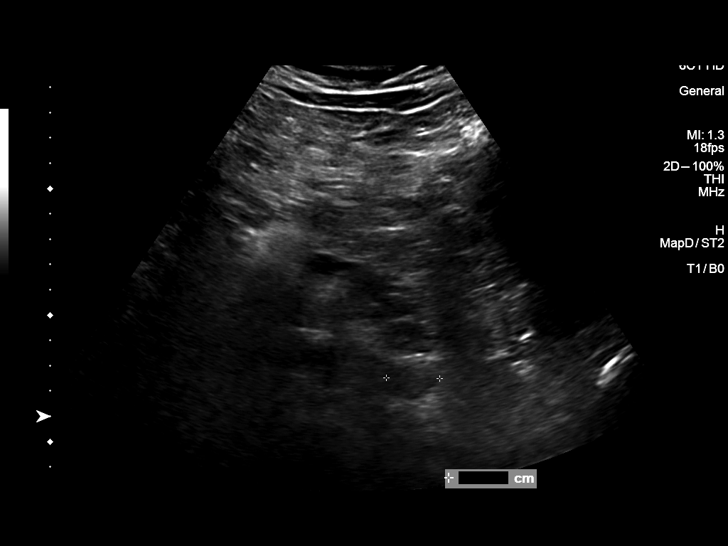
[im 7/19]
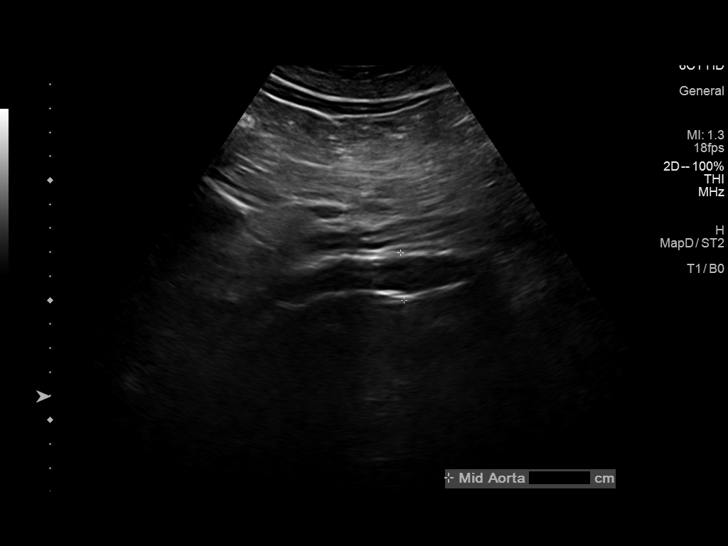
[im 8/19]
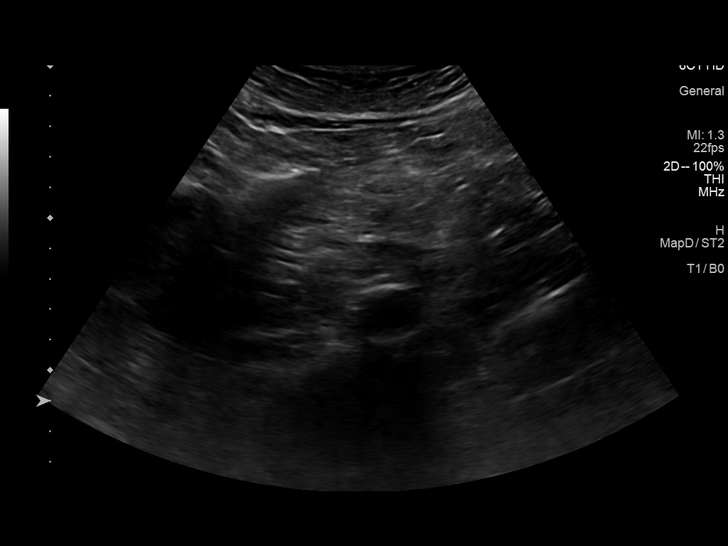
[im 9/19]
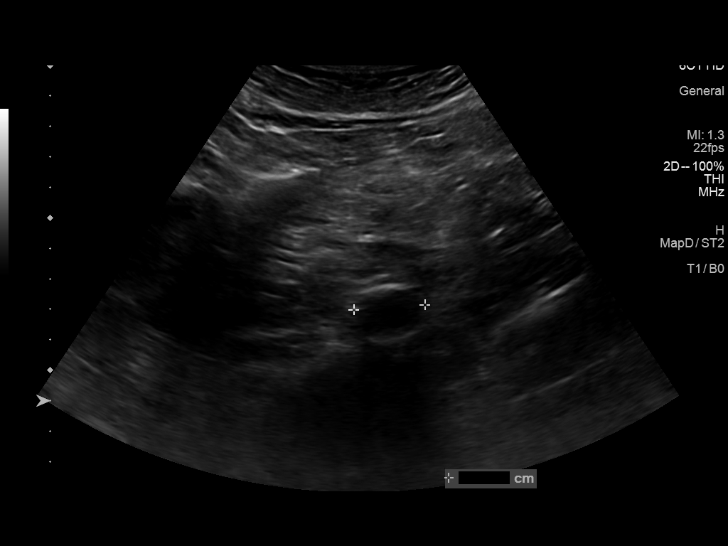
[im 11/19]
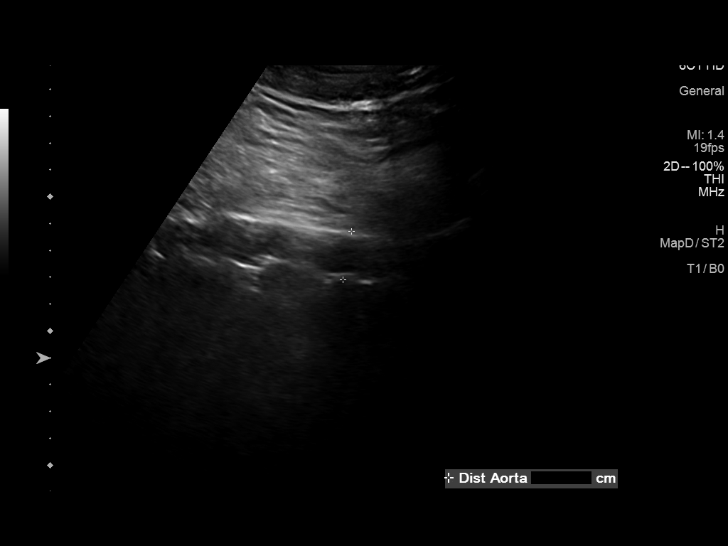
[im 12/19]
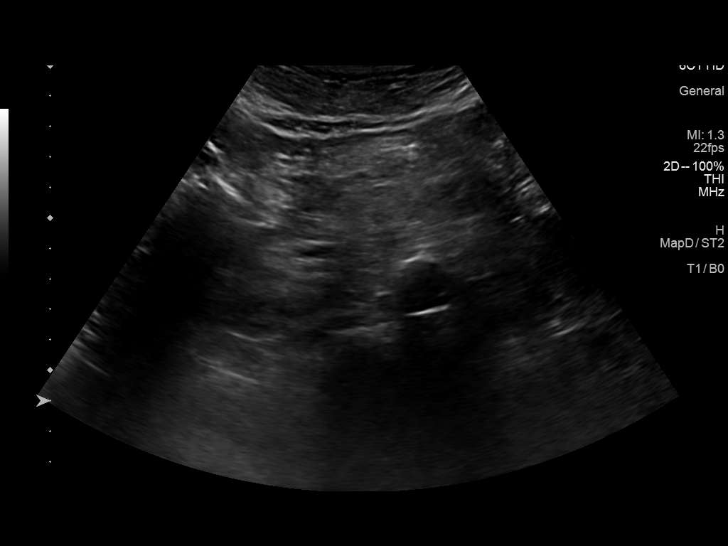
[im 13/19]
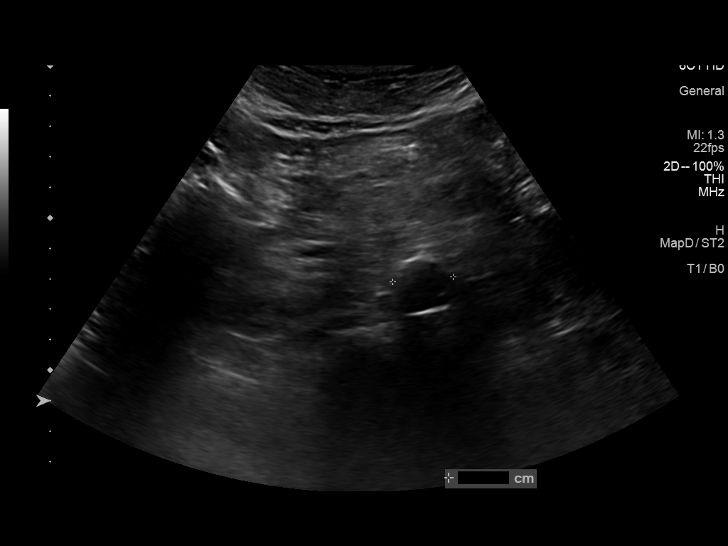
[im 15/19]
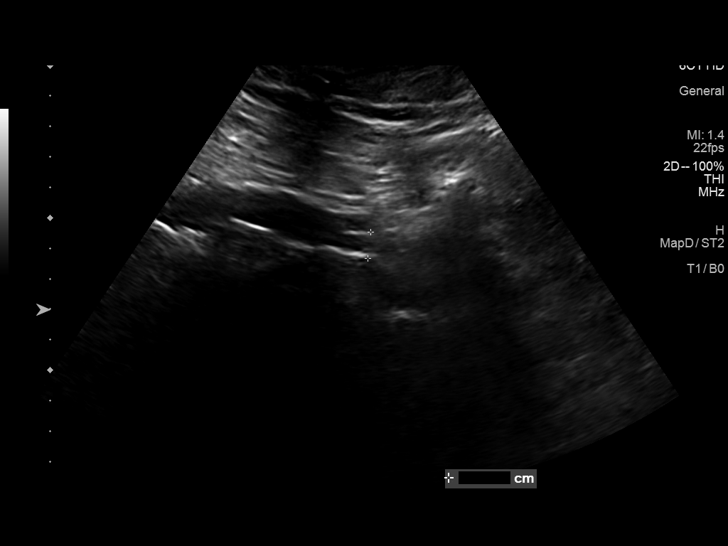
[im 16/19]
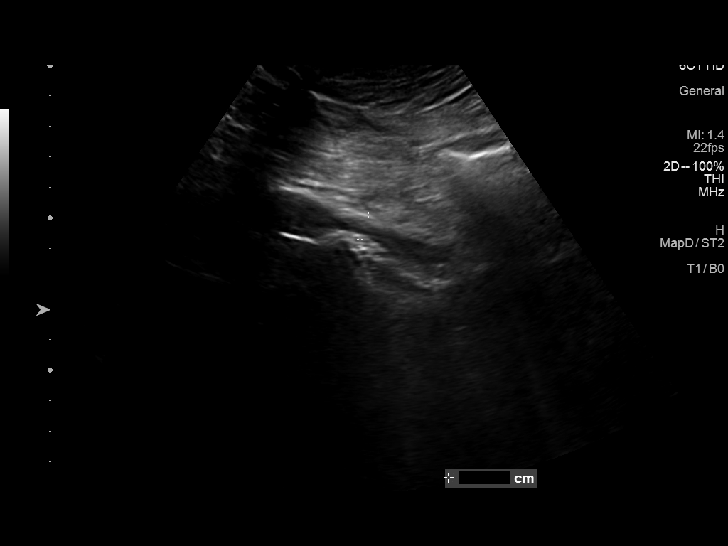
[im 17/19]
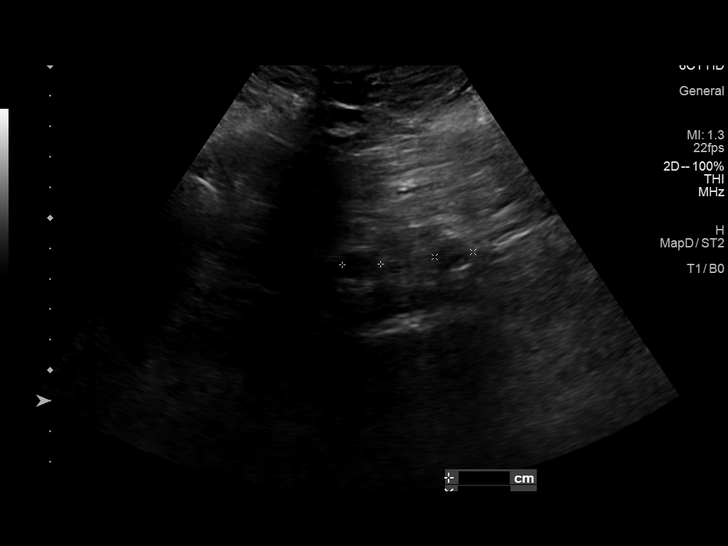
[im 19/19]
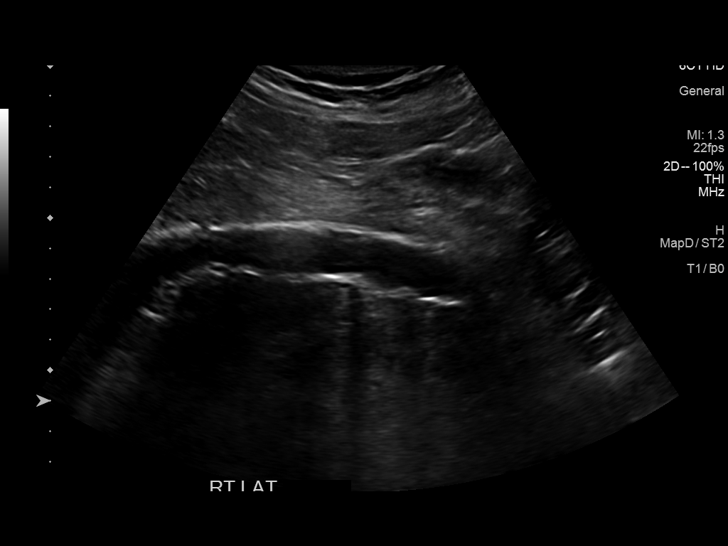

[14 of 19 positions shown; findings below may reference images not displayed]

FINDINGS: Abdominal aortic measurements as follows:

Proximal:  2.1 cm

Mid:  2.3 cm

Distal:  2.0 cm
IMPRESSION: No evidence of abdominal aortic aneurysm.

## 2018-10-08 ENCOUNTER — Telehealth: Payer: Self-pay

## 2018-10-08 NOTE — Telephone Encounter (Signed)
Call attempted to reschedule 10/23/2018 face to face visit with Dr. Saunders Revel to a telephone or video call due to current clinic policies related to [id number] precautions. Left voicemail message requesting for patient to call back to discuss.

## 2018-10-09 ENCOUNTER — Telehealth: Payer: Self-pay | Admitting: Internal Medicine

## 2018-10-09 NOTE — Telephone Encounter (Signed)
Virtual Visit Pre-Appointment Phone Call  Steps For Call:  1. Confirm consent - "In the setting of the current Covid19 crisis, you are scheduled for a (phone or video) visit with your provider on (date) at (time).  Just as we do with many in-office visits, in order for you to participate in this visit, we must obtain consent.  If you'd like, I can send this to your mychart (if signed up) or email for you to review.  Otherwise, I can obtain your verbal consent now.  All virtual visits are billed to your insurance company just like a normal visit would be.  By agreeing to a virtual visit, we'd like you to understand that the technology does not allow for your provider to perform an examination, and thus may limit your provider's ability to fully assess your condition.  Finally, though the technology is pretty good, we cannot assure that it will always work on either your or our end, and in the setting of a video visit, we may have to convert it to a phone-only visit.  In either situation, we cannot ensure that we have a secure connection.  Are you willing to proceed?"  2. Give patient instructions for WebEx download to smartphone as below if video visit  3. Advise patient to be prepared with any vital sign or heart rhythm information, their current medicines, and a piece of paper and pen handy for any instructions they may receive the day of their visit  4. Inform patient they will receive a phone call 15 minutes prior to their appointment time (may be from unknown caller ID) so they should be prepared to answer  5. Confirm that appointment type is correct in Epic appointment notes (video vs telephone)    TELEPHONE CALL NOTE  Frederick Goodwin has been deemed a candidate for a follow-up tele-health visit to limit community exposure during the Covid-19 pandemic. I spoke with the patient via phone to ensure availability of phone/video source, confirm preferred email & phone number, and discuss  instructions and expectations.  I reminded Frederick Goodwin to be prepared with any vital sign and/or heart rhythm information that could potentially be obtained via home monitoring, at the time of his visit. I reminded Frederick Goodwin to expect a phone call at the time of his visit if his visit.  Did the patient verbally acknowledge consent to treatment? Yes   Frederick Goodwin 10/09/2018 11:27 AM   DOWNLOADING THE WEBEX SOFTWARE TO SMARTPHONE  - If [employer], go to [employer] and type in WebEx in the search bar. Frederick Goodwin, the blue/green circle. The app is free but as with any other app downloads, their phone may require them to verify saved payment information or Apple password. The patient does NOT have to create an account.  - If Android, ask patient to go to [employer] and type in WebEx in the search bar. Frederick Goodwin, the blue/green circle. The app is free but as with any other app downloads, their phone may require them to verify saved payment information or Android password. The patient does NOT have to create an account.   CONSENT FOR TELE-HEALTH VISIT - PLEASE REVIEW  I hereby voluntarily request, consent and authorize [facility] and its employed or contracted physicians, physician assistants, nurse practitioners or other licensed health care professionals (the Practitioner), to provide me with telemedicine health care services (the Services") as deemed necessary by the treating Practitioner. I  acknowledge and consent to receive the Services by the Practitioner via telemedicine. I understand that the telemedicine visit will involve communicating with the Practitioner through live audiovisual communication technology and the disclosure of certain medical information by electronic transmission. I acknowledge that I have been given the opportunity to request an in-person assessment or other available alternative prior to the telemedicine visit and am  voluntarily participating in the telemedicine visit.  I understand that I have the right to withhold or withdraw my consent to the use of telemedicine in the course of my care at any time, without affecting my right to future care or treatment, and that the Practitioner or I may terminate the telemedicine visit at any time. I understand that I have the right to inspect all information obtained and/or recorded in the course of the telemedicine visit and may receive copies of available information for a reasonable fee.  I understand that some of the potential risks of receiving the Services via telemedicine include:   Delay or interruption in medical evaluation due to technological equipment failure or disruption;  Information transmitted may not be sufficient (e.g. poor resolution of images) to allow for appropriate medical decision making by the Practitioner; and/or   In rare instances, security protocols could fail, causing a breach of personal health information.  Furthermore, I acknowledge that it is my responsibility to provide information about my medical history, conditions and care that is complete and accurate to the best of my ability. I acknowledge that Practitioner's advice, recommendations, and/or decision may be based on factors not within their control, such as incomplete or inaccurate data provided by me or distortions of diagnostic images or specimens that may result from electronic transmissions. I understand that the practice of medicine is not an exact science and that Practitioner makes no warranties or guarantees regarding treatment outcomes. I acknowledge that I will receive a copy of this consent concurrently upon execution via email to the email address I last provided but may also request a printed copy by calling the office of [facility].    I understand that my insurance will be billed for this visit.   I have read or had this consent read to me.  I understand the  contents of this consent, which adequately explains the benefits and risks of the Services being provided via telemedicine.   I have been provided ample opportunity to ask questions regarding this consent and the Services and have had my questions answered to my satisfaction.  I give my informed consent for the services to be provided through the use of telemedicine in my medical care  By participating in this telemedicine visit I agree to the above.

## 2018-10-21 NOTE — Progress Notes (Signed)
Virtual Visit via Video Note   This visit type was conducted due to national recommendations for restrictions regarding the COVID-19 Pandemic (e.g. social distancing) in an effort to limit this patient's exposure and mitigate transmission in our community.  Due to his co-morbid illnesses, this patient is at least at moderate risk for complications without adequate follow up.  This format is felt to be most appropriate for this patient at this time.  All issues noted in this document were discussed and addressed.  A limited physical exam was performed with this format.  Please refer to the patient's chart for his consent to telehealth for [facility].   Evaluation Performed:  Follow-up visit  Date:  10/23/2018   ID:  Frederick Goodwin, DOB 28-Jun-1946, MRN [id number]  Patient Location: Home Provider Location: Home  PCP:  Orpah Melter, MD  [name], MD  Electrophysiologist:  None   Chief Complaint: Follow-up nonischemic cardiomyopathy  History of Present Illness:    Frederick Goodwin is a 73 y.o. male with history of nonischemic cardiomyopathy in the setting of frequent PVCs, non-obstructive coronary artery disease with moderate proximal LAD stenosis, peptic ulcer disease, erectile dysfunction, and skin cancer.  We are speaking today for follow-up of his cardiomyopathy.  I last saw him in September, at which time he was doing well.  He had noted preceding back pain that resolved with a statin holiday.  We agreed to start rosuvastatin 5 mg daily and also increase carvedilol to 6.25 mg twice daily.  Today, Frederick Goodwin reports that he has been feeling well.  He denies chest pain, shortness of breath, palpitations, lightheadedness, and edema.  He is exercising regularly, trying to walk 3 miles a day.  He is tolerating his current medications, including rosuvastatin and increased dose of carvedilol.  He notes having had labs done through his PCP in January, which he believes  were normal.  [place].  He is practicing social distancing.  bp usually lower.  117/68 prior check  The patient does not have symptoms concerning for COVID-19 infection (fever, chills, cough, or new shortness of breath).    Past Medical History:  Diagnosis Date  . Arrhythmia   . Arthritis   . Cancer ([place])   . Erectile dysfunction   . History of inguinal hernia repair    right  . Hx of ulcer disease    20 YRS AGO  . Mental disorder SKIN CANCER  . PVC (premature ventricular contraction)   . Snores    Past Surgical History:  Procedure Laterality Date  . HERNIA REPAIR     RIH as a child  . INGUINAL HERNIA REPAIR  08/15/2011   Procedure: LAPAROSCOPIC INGUINAL HERNIA;  Surgeon: Edward Jolly, MD;  Location: [facility];  Service: General;  Laterality: Right;  LAPAROSCOPIC RIGHT INGUINAL HERNIA Repair  . LEFT HEART CATH AND CORONARY ANGIOGRAPHY N/A 10/24/2017   Procedure: LEFT HEART CATH AND CORONARY ANGIOGRAPHY;  Surgeon: [name], MD;  Location: [facility];  Service: Cardiovascular;  Laterality: N/A;  . TONSILLECTOMY  1966     Current Meds  Medication Sig  . aspirin EC 81 MG tablet Take 1 tablet (81 mg total) by mouth daily.  . carvedilol (COREG) 6.25 MG tablet Take 1 tablet (6.25 mg total) by mouth 2 (two) times daily.  . Coenzyme Q10 (CO Q-10) 100 MG CAPS Take 1 capsule by mouth daily.  Marland Kitchen  fexofenadine (ALLEGRA) 180 MG tablet Take 180 mg by mouth daily as needed for allergies.   Marland Kitchen lisinopril (PRINIVIL,ZESTRIL) 5 MG tablet Take 5 mg by mouth daily.  Marland Kitchen omeprazole (PRILOSEC) 20 MG capsule Take 20 mg by mouth daily.   . rosuvastatin (CRESTOR) 5 MG tablet Take 1 tablet (5 mg total) by mouth daily.  . sildenafil (VIAGRA) 100 MG tablet Take 100 mg by mouth as needed for erectile dysfunction.      Allergies:   Patient has no known allergies.   Social History   Tobacco Use  . Smoking status: Former  Smoker    Packs/day: 1.00    Years: 30.00    Pack years: 30.00    Types: Cigarettes    Last attempt to quit: 1999    Years since quitting: 21.3  . Smokeless tobacco: Never Used  Substance Use Topics  . Alcohol use: Yes    Alcohol/week: 8.0 standard drinks    Types: 1 Standard drinks or equivalent, 7 Glasses of wine per week  . Drug use: No     Family Hx: The patient's family history includes COPD in his mother; Cancer in his father; Heart disease in his mother.  ROS:   Please see the history of present illness.   All other systems reviewed and are negative.   Prior CV studies:   The following studies were reviewed today:  Cath/PCI:  LHC (10/24/17): LMCA normal. Proximal LAD with 40% stenosis. OM1 with 50% stenosis. LCx otherwise normal. Minimal diffuse disease involving RCA. LVEDP 17 mmHg. No aortic stenosis.  Non-Invasive Evaluation(s):  TTE (02/13/2018): Normal LV size with mild LVH.  LVEF 40-45% with global hypokinesis and grade 1 diastolic dysfunction.  Borderline enlarged aortic root (3.8 cm).  Mitral annular calcification.  Mild left atrial enlargement.  Normal RV size and function.  TTE (10/15/17): Moderately dilated LV with normal wall thickness. LVEF 30-35% with diffuse hypokinesis. Elevated filling pressure noted. MAC with mild MR. Mild LAE.  Labs/Other Tests and Data Reviewed:    EKG:  An ECG dated 03/31/2018 was personally reviewed today and demonstrated:  Normal sinus rhythm with nonspecific intraventricular conduction delay with left bundle-like appearance.  Recent Labs: 12/31/2017: ALT 19; BUN 17; Creatinine, Ser 1.00; Potassium 4.9; Sodium 140   Recent Lipid Panel Lab Results  Component Value Date/Time   CHOL 171 12/31/2017 09:01 AM   TRIG 52 12/31/2017 09:01 AM   HDL 63 12/31/2017 09:01 AM   CHOLHDL 2.7 12/31/2017 09:01 AM   LDLCALC 98 12/31/2017 09:01 AM    Wt Readings from Last 3 Encounters:  10/23/18 223 lb (101.2 kg)  03/31/18 223 lb  12.8 oz (101.5 kg)  12/12/17 216 lb (98 kg)     Objective:    Vital Signs:  BP (!) 142/89 (BP Location: Left Arm, Patient Position: Sitting, Cuff Size: Normal)   Pulse 72   Ht 6' (1.829 m)   Wt 223 lb (101.2 kg)   BMI 30.24 kg/m    VITAL SIGNS:  reviewed GEN:  no acute distress  ASSESSMENT & PLAN:    Chronic systolic heart failure due to nonischemic cardiomyopathy: Frederick Goodwin continues to do well without any limitations.  He has not been weighing himself regularly but feels like he has lost weight due to increased activity.  He reports NYHA class I heart failure symptoms.  He is tolerating current regimen of carvedilol and lisinopril well.  Usually, his blood pressure is better controlled than today's reading.  We have agreed  to defer additional testing or medication changes at this time.  Coronary artery disease: No symptoms to suggest worsening coronary insufficiency with catheterization last year showing moderate, nonobstructive CAD.  I will attempt to obtain records of labs from earlier this year from Frederick Goodwin PCP to ensure that his LDL is at goal (less than 70).  Continue medical therapy with aspirin and carvedilol.  Hypertension: Blood pressure mildly elevated today but typically better at home with systolic readings as low as 118.  No medication changes today.  Hyperlipidemia Continue rosuvastatin 5 mg daily.  I will attempt to obtain labs from Frederick Goodwin PCP to ensure that LDL is at goal (less than 70).  If it is not at goal, further escalation of rosuvastatin will need to be considered.  COVID-19 Education: The signs and symptoms of COVID-19 were discussed with the patient and how to seek care for testing (follow up with PCP or arrange E-visit).  The importance of social distancing was discussed today.  Time:   Today, I have spent 11 minutes with the patient with telehealth technology discussing the above problems.     Medication Adjustments/Labs and Tests  Ordered: Current medicines are reviewed at length with the patient today.  Concerns regarding medicines are outlined above.   Tests Ordered: No orders of the defined types were placed in this encounter.   Medication Changes: No orders of the defined types were placed in this encounter.   Disposition:  Follow up in 6 month(s)  Signed, [name], MD  10/23/2018 9:40 AM    [facility]

## 2018-10-23 ENCOUNTER — Other Ambulatory Visit: Payer: Self-pay

## 2018-10-23 ENCOUNTER — Telehealth: Payer: Self-pay | Admitting: Internal Medicine

## 2018-10-23 ENCOUNTER — Telehealth (INDEPENDENT_AMBULATORY_CARE_PROVIDER_SITE_OTHER): Payer: Medicare Other | Admitting: Internal Medicine

## 2018-10-23 ENCOUNTER — Encounter: Payer: Self-pay | Admitting: Internal Medicine

## 2018-10-23 VITALS — BP 142/89 | HR 72 | Ht 72.0 in | Wt 223.0 lb

## 2018-10-23 DIAGNOSIS — I110 Hypertensive heart disease with heart failure: Secondary | ICD-10-CM | POA: Diagnosis not present

## 2018-10-23 DIAGNOSIS — I10 Essential (primary) hypertension: Secondary | ICD-10-CM

## 2018-10-23 DIAGNOSIS — I2510 Atherosclerotic heart disease of native coronary artery without angina pectoris: Secondary | ICD-10-CM

## 2018-10-23 DIAGNOSIS — I43 Cardiomyopathy in diseases classified elsewhere: Secondary | ICD-10-CM

## 2018-10-23 DIAGNOSIS — E785 Hyperlipidemia, unspecified: Secondary | ICD-10-CM

## 2018-10-23 DIAGNOSIS — I428 Other cardiomyopathies: Secondary | ICD-10-CM

## 2018-10-23 DIAGNOSIS — I5022 Chronic systolic (congestive) heart failure: Secondary | ICD-10-CM

## 2018-10-23 MED ORDER — ROSUVASTATIN CALCIUM 10 MG PO TABS: 10.0000 mg | ORAL_TABLET | Freq: Every day | ORAL | 3 refills | Status: DC

## 2018-10-23 NOTE — Telephone Encounter (Signed)
I have reviewed outside labs from 06/2008, which are notable for LDL of 89 and ALT of 22.  Given moderate CAD noted by catheterization last year, I recommend that we target an LDL of less than 70.  We will reach out to Mr. Knoll and recommend that he increase his rosuvastatin to 10 mg daily.  We will a repeat lipid panel and ALT in about 3 months.  [name], MD [facility] Pager: [phone]

## 2018-10-23 NOTE — Telephone Encounter (Signed)
Called patient and he verbalized understanding of Dr Darnelle Bos recommendations. He verbalized understanding to increase rosuvastatin to 10 mg daily. Rx sent to pharmacy.  He is aware to contact the [facility] street office in July to set appt to come in for fasting lab work for LIPID and ALT. He was very Patent attorney.

## 2018-10-23 NOTE — Addendum Note (Signed)
Addended by: Vanessa Ralphs on: 10/23/2018 01:52 PM   Modules accepted: Orders

## 2018-10-23 NOTE — Patient Instructions (Signed)
Medication Instructions:  Your physician recommends that you continue on your current medications as directed. Please refer to the Current Medication list given to you today.  If you need a refill on your cardiac medications before your next appointment, please call your pharmacy.   Lab work: none If you have labs (blood work) drawn today and your tests are completely normal, you will receive your results only by: Marland Kitchen MyChart Message (if you have MyChart) OR . A paper copy in the mail If you have any lab test that is abnormal or we need to change your treatment, we will call you to review the results.  Testing/Procedures: none  Follow-Up: At [facility], you and your health needs are our priority.  As part of our continuing mission to provide you with exceptional heart care, we have created designated Provider Care Teams.  These Care Teams include your primary Cardiologist (physician) and Advanced Practice Providers (APPs -  Physician Assistants and Nurse Practitioners) who all work together to provide you with the care you need, when you need it. You will need a follow up appointment in 6 months.  Please call our office 2 months in advance to schedule this appointment.  You may see [name], MD or one of the following Advanced Practice Providers on your designated Care Team:   Murray Hodgkins, NP Christell Faith, PA-C . Marrianne Mood, PA-C

## 2018-12-01 DIAGNOSIS — D2262 Melanocytic nevi of left upper limb, including shoulder: Secondary | ICD-10-CM | POA: Diagnosis not present

## 2018-12-01 DIAGNOSIS — Z85828 Personal history of other malignant neoplasm of skin: Secondary | ICD-10-CM | POA: Diagnosis not present

## 2018-12-01 DIAGNOSIS — L570 Actinic keratosis: Secondary | ICD-10-CM | POA: Diagnosis not present

## 2018-12-01 DIAGNOSIS — D2261 Melanocytic nevi of right upper limb, including shoulder: Secondary | ICD-10-CM | POA: Diagnosis not present

## 2018-12-08 MED ORDER — LISINOPRIL 5 MG PO TABS: 5.0000 mg | ORAL_TABLET | Freq: Every day | ORAL | 3 refills | Status: DC

## 2018-12-08 NOTE — Addendum Note (Signed)
Addended by: Lamar Laundry on: 12/08/2018 04:59 PM   Modules accepted: Orders

## 2019-01-06 ENCOUNTER — Other Ambulatory Visit: Payer: Medicare Other

## 2019-01-06 ENCOUNTER — Other Ambulatory Visit: Payer: Self-pay

## 2019-01-06 DIAGNOSIS — E785 Hyperlipidemia, unspecified: Secondary | ICD-10-CM

## 2019-01-06 DIAGNOSIS — I10 Essential (primary) hypertension: Secondary | ICD-10-CM | POA: Diagnosis not present

## 2019-01-06 LAB — LIPID PANEL
Chol/HDL Ratio: 2.4 ratio (ref 0.0–5.0)
Cholesterol, Total: 147 mg/dL (ref 100–199)
HDL: 61 mg/dL (ref >39)
LDL Calculated: 66 mg/dL (ref 0–99)
Triglycerides: 100 mg/dL (ref 0–149)
VLDL Cholesterol Cal: 20 mg/dL (ref 5–40)

## 2019-01-06 LAB — ALT: ALT: 28 IU/L (ref 0–44)

## 2019-01-13 ENCOUNTER — Ambulatory Visit: Payer: Medicare Other | Admitting: Podiatry

## 2019-01-13 ENCOUNTER — Other Ambulatory Visit: Payer: Self-pay

## 2019-01-13 ENCOUNTER — Encounter: Payer: Self-pay | Admitting: Podiatry

## 2019-01-13 VITALS — Temp 97.9°F

## 2019-01-13 DIAGNOSIS — B351 Tinea unguium: Secondary | ICD-10-CM | POA: Diagnosis not present

## 2019-01-13 DIAGNOSIS — L853 Xerosis cutis: Secondary | ICD-10-CM

## 2019-01-13 DIAGNOSIS — L84 Corns and callosities: Secondary | ICD-10-CM | POA: Diagnosis not present

## 2019-01-21 NOTE — Progress Notes (Signed)
Subjective:   Patient ID: Frederick Goodwin, male   DOB: 73 y.o.   MRN: [id number]   HPI 73 year old male presents the office today for concerns of toenail fungus on his right side.  He notes the nails becoming thick and discolored, dark discoloration.  He is tried over-the-counter medications not any significant improvement this is been ongoing issue for the last 2 to 3 years.  He also notes some dry skin to his feet may peel at times but denies any significant itching.  He also has calluses to both his feet as he walks a lot.  No open lesions.   Review of Systems  All other systems reviewed and are negative.  Past Medical History:  Diagnosis Date  . Arrhythmia   . Arthritis   . Cancer ([place])   . Erectile dysfunction   . History of inguinal hernia repair    right  . Hx of ulcer disease    20 YRS AGO  . Mental disorder SKIN CANCER  . PVC (premature ventricular contraction)   . Snores     Past Surgical History:  Procedure Laterality Date  . HERNIA REPAIR     RIH as a child  . INGUINAL HERNIA REPAIR  08/15/2011   Procedure: LAPAROSCOPIC INGUINAL HERNIA;  Surgeon: Edward Jolly, MD;  Location: [facility];  Service: General;  Laterality: Right;  LAPAROSCOPIC RIGHT INGUINAL HERNIA Repair  . LEFT HEART CATH AND CORONARY ANGIOGRAPHY N/A 10/24/2017   Procedure: LEFT HEART CATH AND CORONARY ANGIOGRAPHY;  Surgeon: [name], MD;  Location: [facility];  Service: Cardiovascular;  Laterality: N/A;  . TONSILLECTOMY  1966     Current Outpatient Medications:  .  NON FORMULARY, [facility]  ANTI-FUNGAL (NAIL)-#1, Disp: , Rfl:  .  aspirin EC 81 MG tablet, Take 1 tablet (81 mg total) by mouth daily., Disp: 90 tablet, Rfl: 3 .  carvedilol (COREG) 6.25 MG tablet, Take 1 tablet (6.25 mg total) by mouth 2 (two) times daily., Disp: 180 tablet, Rfl: 3 .  Coenzyme Q10 (CO Q-10) 100 MG CAPS, Take 1 capsule by mouth daily., Disp: , Rfl:  .  fexofenadine (ALLEGRA) 180 MG tablet, Take  180 mg by mouth daily as needed for allergies. , Disp: , Rfl:  .  lisinopril (ZESTRIL) 5 MG tablet, Take 1 tablet (5 mg total) by mouth daily., Disp: 90 tablet, Rfl: 3 .  omeprazole (PRILOSEC) 20 MG capsule, Take 20 mg by mouth daily. , Disp: , Rfl:  .  rosuvastatin (CRESTOR) 10 MG tablet, Take 1 tablet (10 mg total) by mouth daily., Disp: 90 tablet, Rfl: 3  No Known Allergies       Objective:  Physical Exam  General: AAO x3, NAD  Dermatological: Nails appear to be hypertrophic, dystrophic with notable discoloration.  There is no significant hyperpigmentation no hyperpigmentation of the surrounding skin.  Dry skin present.  Hyperkeratotic tissue present bilaterally.  No ulceration drainage or signs of infection.  Vascular: Dorsalis Pedis artery and Posterior Tibial artery pedal pulses are 2/4 bilateral with immedate capillary fill time. There is no pain with calf compression, swelling, warmth, erythema.   Neruologic: Grossly intact via light touch bilateral.   Musculoskeletal: No gross boney pedal deformities bilateral. No pain, crepitus, or limitation noted with foot and ankle range of motion bilateral. Muscular strength 5/5 in all groups tested bilateral.      Assessment:   Onychomycosis, callus formation     Plan:  -Treatment options discussed including all  alternatives, risks, and complications -Etiology of symptoms were discussed -Today I ordered a compound cream for onychomycosis.  We discussed multiple treatment options. -Debrided the callus without complications or bleeding.  Recommend moisturizer for these as well as the dry skin.  Return in about 3 months (around 04/15/2019).  Trula Slade DPM

## 2019-02-05 DIAGNOSIS — H2513 Age-related nuclear cataract, bilateral: Secondary | ICD-10-CM | POA: Diagnosis not present

## 2019-02-05 DIAGNOSIS — H40033 Anatomical narrow angle, bilateral: Secondary | ICD-10-CM | POA: Diagnosis not present

## 2019-04-03 DIAGNOSIS — M79642 Pain in left hand: Secondary | ICD-10-CM | POA: Diagnosis not present

## 2019-04-09 DIAGNOSIS — M25532 Pain in left wrist: Secondary | ICD-10-CM | POA: Diagnosis not present

## 2019-04-09 DIAGNOSIS — M13842 Other specified arthritis, left hand: Secondary | ICD-10-CM | POA: Diagnosis not present

## 2019-04-14 ENCOUNTER — Encounter (INDEPENDENT_AMBULATORY_CARE_PROVIDER_SITE_OTHER): Payer: Self-pay

## 2019-04-20 ENCOUNTER — Other Ambulatory Visit: Payer: Self-pay

## 2019-04-20 ENCOUNTER — Encounter: Payer: Self-pay | Admitting: Podiatry

## 2019-04-20 ENCOUNTER — Ambulatory Visit: Payer: Medicare Other | Admitting: Podiatry

## 2019-04-20 DIAGNOSIS — B351 Tinea unguium: Secondary | ICD-10-CM

## 2019-04-20 DIAGNOSIS — L84 Corns and callosities: Secondary | ICD-10-CM | POA: Diagnosis not present

## 2019-04-20 NOTE — Progress Notes (Signed)
Follow-up Outpatient Visit Date: 04/22/2019  Primary Care Provider: Orpah Melter, MD [address] [place] [address]  Chief Complaint: Follow-up cardiomyopathy  HPI:  Mr. Dewoody is a 73 y.o. year-old male with history of nonischemic cardiomyopathy in the setting of frequent PVCs,non-obstructive coronary artery disease with moderate proximal LAD stenosis,peptic ulcer disease, erectile dysfunction, and skin cancer, who presents for follow-up of non-ischemic cardiomyopathy.  We last spoke in late-April, at which time he was doing well and exercising regularly.  We did not make any medication changes at that time.  Mr. Gornick feels well today, denying chest pain, shortness of breath, palpitations, lightheadedness, edema, and orthopnea.  He is tolerating his current medication regimen well.  --------------------------------------------------------------------------------------------------  Cardiovascular History & Procedures: Cardiovascular Problems:  PVCs  NICM  Non-obstructive CAD  Risk Factors:  Age greater than 42 and male gender  Cath/PCI:  LHC (10/24/17): LMCA normal. Proximal LAD with 40% stenosis. OM1 with 50% stenosis. LCx otherwise normal. Minimal diffuse disease involving RCA. LVEDP 17 mmHg. No aortic stenosis.  CV Surgery:  None  EP Procedures and Devices:  None  Non-Invasive Evaluation(s):  TTE (02/13/18): Normal LV size with mild LVH.  LVEF 40-45% with grade 1 diastolic dysfunction.  Mildly dilated ascending aorta (3.9 cm).  Mitral annular calcification.  Normal RV size and function.  TTE (10/15/17): Moderately dilated LV with normal wall thickness. LVEF 30-35% with diffuse hypokinesis. Elevated filling pressure noted. MAC with mild MR. Mild LAE.  Recent CV Pertinent Labs: Lab Results  Component Value Date   CHOL 147 01/06/2019   HDL 61 01/06/2019   LDLCALC 66 01/06/2019   TRIG 100 01/06/2019   CHOLHDL 2.4 01/06/2019   INR 1.1 10/18/2017   K 4.5 04/22/2019   MG 2.3 10/07/2017   BUN 18 04/22/2019   CREATININE 1.02 04/22/2019    Past medical and surgical history were reviewed and updated in EPIC.  Current Meds  Medication Sig  . aspirin EC 81 MG tablet Take 1 tablet (81 mg total) by mouth daily.  . carvedilol (COREG) 6.25 MG tablet Take 1 tablet (6.25 mg total) by mouth 2 (two) times daily.  . Coenzyme Q10 (CO Q-10) 100 MG CAPS Take 1 capsule by mouth daily.  . fexofenadine (ALLEGRA) 180 MG tablet Take 180 mg by mouth daily as needed for allergies.   . NON FORMULARY [facility]  ANTI-FUNGAL (NAIL)-#1  . omeprazole (PRILOSEC) 20 MG capsule Take 20 mg by mouth daily.   . rosuvastatin (CRESTOR) 10 MG tablet Take 1 tablet (10 mg total) by mouth daily.  . [DISCONTINUED] lisinopril (ZESTRIL) 5 MG tablet Take 1 tablet (5 mg total) by mouth daily.    Allergies: Patient has no known allergies.  Social History   Tobacco Use  . Smoking status: Former Smoker    Packs/day: 1.00    Years: 30.00    Pack years: 30.00    Types: Cigarettes    Quit date: 1999    Years since quitting: 21.8  . Smokeless tobacco: Never Used  Substance Use Topics  . Alcohol use: Yes    Alcohol/week: 8.0 standard drinks    Types: 1 Standard drinks or equivalent, 7 Glasses of wine per week  . Drug use: No    Family History  Problem Relation Age of Onset  . Heart disease Mother        Atrial fibrillation  . COPD Mother   . Cancer Father        leukemia  Review of Systems: A 12-system review of systems was performed and was negative except as noted in the HPI.  --------------------------------------------------------------------------------------------------  Physical Exam: BP 138/64 (BP Location: Left Arm, Patient Position: Sitting, Cuff Size: Normal)   Pulse 68   Ht 6' (1.829 m)   Wt 222 lb 8 oz (100.9 kg)   SpO2 98%   BMI 30.18 kg/m   General:  NAD HEENT: No conjunctival pallor or scleral icterus.  Facemask in place. Neck: Supple without lymphadenopathy, thyromegaly, JVD, or HJR. Lungs: Normal work of breathing. Clear to auscultation bilaterally without wheezes or crackles. Heart: Regular rate and rhythm without murmurs, rubs, or gallops. Non-displaced PMI. Abd: Bowel sounds present. Soft, NT/ND without hepatosplenomegaly Ext: No lower extremity edema. Radial, PT, and DP pulses are 2+ bilaterally. Skin: Warm and dry without rash.  EKG:  NSR with LBBB.  Lab Results  Component Value Date   WBC 4.8 10/18/2017   HGB 15.5 10/18/2017   HCT 44.5 10/18/2017   MCV 94 10/18/2017   PLT 196 10/18/2017    Lab Results  Component Value Date   NA 137 04/22/2019   K 4.5 04/22/2019   CL 102 04/22/2019   CO2 23 04/22/2019   BUN 18 04/22/2019   CREATININE 1.02 04/22/2019   GLUCOSE 94 04/22/2019   ALT 28 01/06/2019    Lab Results  Component Value Date   CHOL 147 01/06/2019   HDL 61 01/06/2019   LDLCALC 66 01/06/2019   TRIG 100 01/06/2019   CHOLHDL 2.4 01/06/2019    --------------------------------------------------------------------------------------------------  ASSESSMENT AND PLAN: NICM: Mr. Kimberlin appears euvolemic with NYHA class I symptoms.  We have agreed to increase lisinopril to 10 mg daily in an effort to optimize his goal-directed medical therapy.  I will check a BMP today and again in ~2 weeks.  We will continue carvedilol 6.25 mg BID.  Hyperlipidemia: LDL at goal.  Continue rosuvastatin 10 mg daily.  Non-obstructive coronary artery disease: No symptoms to suggest worsening coronary insuffiencey.  Continue ASA, carvedilol, and rosuvastatin for primary prevention.  Follow-up: Return to clinic in 6 months.  [name], MD 04/23/2019 8:18 PM

## 2019-04-21 NOTE — Progress Notes (Signed)
Subjective: 73 year old male presents the office with concerns of toenail fungus on his right foot.  Is been using a compound cream he thinks has been helpful and he has seen improvement.  He has no new concerns. Denies any systemic complaints such as fevers, chills, nausea, vomiting. No acute changes since last appointment, and no other complaints at this time.   Objective: AAO x3, NAD DP/PT pulses palpable bilaterally, CRT less than 3 seconds Overall coloration has improved the toenail started weekly on the proximal aspect of the right hallux toenail.  There is no pain the nail is no redness or drainage or any signs of infection.  Mild callus formation along the hallux.  No ongoing ulceration or signs of infection. No pain with calf compression, swelling, warmth, erythema  Assessment: Onychomycosis with improvement, hyperkeratotic lesion  Plan: -All treatment options discussed with the patient including all alternatives, risks, complications.  -Plan to continue with topical antifungal we discussed duration of use as well as side effects. -Debrided the hyperkeratotic lesion or any complications or bleeding.  Moisturizer daily, offloading. -Patient encouraged to call the office with any questions, concerns, change in symptoms.    Trula Slade DPM

## 2019-04-22 ENCOUNTER — Ambulatory Visit (INDEPENDENT_AMBULATORY_CARE_PROVIDER_SITE_OTHER): Payer: Medicare Other | Admitting: Internal Medicine

## 2019-04-22 ENCOUNTER — Encounter: Payer: Self-pay | Admitting: Internal Medicine

## 2019-04-22 ENCOUNTER — Other Ambulatory Visit: Payer: Self-pay

## 2019-04-22 VITALS — BP 138/64 | HR 68 | Ht 72.0 in | Wt 222.5 lb

## 2019-04-22 DIAGNOSIS — I2510 Atherosclerotic heart disease of native coronary artery without angina pectoris: Secondary | ICD-10-CM

## 2019-04-22 DIAGNOSIS — I428 Other cardiomyopathies: Secondary | ICD-10-CM | POA: Diagnosis not present

## 2019-04-22 DIAGNOSIS — I10 Essential (primary) hypertension: Secondary | ICD-10-CM

## 2019-04-22 DIAGNOSIS — E785 Hyperlipidemia, unspecified: Secondary | ICD-10-CM | POA: Diagnosis not present

## 2019-04-22 DIAGNOSIS — Z79899 Other long term (current) drug therapy: Secondary | ICD-10-CM

## 2019-04-22 MED ORDER — LISINOPRIL 10 MG PO TABS: 10.0000 mg | ORAL_TABLET | Freq: Every day | ORAL | 3 refills | Status: DC

## 2019-04-22 NOTE — Patient Instructions (Signed)
Medication Instructions:  Your physician has recommended you make the following change in your medication:  1- INCREASE Lisinopril to 10 mg by mouth once a day.  *If you need a refill on your cardiac medications before your next appointment, please call your pharmacy*  Lab Work: 1) Your physician recommends that you return for lab work in: [place].  2) Your physician recommends that you return for lab work in: 2 weeks around November 4th.  BMET  At [facility]. You will need to have an appointment.  If you have labs (blood work) drawn today and your tests are completely normal, you will receive your results only by: Marland Kitchen MyChart Message (if you have MyChart) OR . A paper copy in the mail If you have any lab test that is abnormal or we need to change your treatment, we will call you to review the results.  Testing/Procedures: NONE  Follow-Up: At [facility], you and your health needs are our priority.  As part of our continuing mission to provide you with exceptional heart care, we have created designated Provider Care Teams.  These Care Teams include your primary Cardiologist (physician) and Advanced Practice Providers (APPs -  Physician Assistants and Nurse Practitioners) who all work together to provide you with the care you need, when you need it.  Your next appointment:   6 months  The format for your next appointment:   In Person  Provider:    You may see [name], MD or one of the following Advanced Practice Providers on your designated Care Team:    Murray Hodgkins, NP  Christell Faith, PA-C  Marrianne Mood, PA-C

## 2019-04-23 ENCOUNTER — Encounter: Payer: Self-pay | Admitting: Internal Medicine

## 2019-04-23 LAB — BASIC METABOLIC PANEL
BUN/Creatinine Ratio: 18 (ref 10–24)
BUN: 18 mg/dL (ref 8–27)
CO2: 23 mmol/L (ref 20–29)
Calcium: 9.8 mg/dL (ref 8.6–10.2)
Chloride: 102 mmol/L (ref 96–106)
Creatinine, Ser: 1.02 mg/dL (ref 0.76–1.27)
GFR calc Af Amer: 85 mL/min/{1.73_m2} (ref >59)
GFR calc non Af Amer: 74 mL/min/{1.73_m2} (ref >59)
Glucose: 94 mg/dL (ref 65–99)
Potassium: 4.5 mmol/L (ref 3.5–5.2)
Sodium: 137 mmol/L (ref 134–144)

## 2019-04-24 ENCOUNTER — Telehealth: Payer: Self-pay | Admitting: Internal Medicine

## 2019-04-24 NOTE — Telephone Encounter (Signed)
Notes recorded by Lamar Laundry, RN on 04/24/2019 at 8:57 AM EDT  Results released to [facility].

## 2019-04-24 NOTE — Telephone Encounter (Signed)
I spoke with the patient regarding his lab results. 

## 2019-04-24 NOTE — Telephone Encounter (Signed)
Notes recorded by End, Harrell Gave, MD on 04/23/2019 at 9:04 PM EDT  Please let Frederick Goodwin know that his kidney function and electrolytes are normal. He should increase lisinopril to 10 mg daily, as discussed yesterday. He should have a repeat BMP in ~2 weeks at [facility] office.

## 2019-04-24 NOTE — Telephone Encounter (Signed)
Patient has not reviewed results in [place].  Attempted to call the patient- no answer- I left a message to please call back.

## 2019-04-24 NOTE — Telephone Encounter (Signed)
Patient returning call for lab results. 

## 2019-04-30 ENCOUNTER — Other Ambulatory Visit: Payer: Self-pay | Admitting: Internal Medicine

## 2019-05-06 ENCOUNTER — Other Ambulatory Visit: Payer: Self-pay

## 2019-05-06 DIAGNOSIS — Z20822 Contact with and (suspected) exposure to covid-19: Secondary | ICD-10-CM

## 2019-05-07 ENCOUNTER — Other Ambulatory Visit: Payer: Self-pay

## 2019-05-07 ENCOUNTER — Other Ambulatory Visit: Payer: Medicare Other | Admitting: *Deleted

## 2019-05-07 DIAGNOSIS — I428 Other cardiomyopathies: Secondary | ICD-10-CM

## 2019-05-07 DIAGNOSIS — Z79899 Other long term (current) drug therapy: Secondary | ICD-10-CM | POA: Diagnosis not present

## 2019-05-07 LAB — NOVEL CORONAVIRUS, NAA: SARS-CoV-2, NAA: NOT DETECTED

## 2019-05-08 LAB — BASIC METABOLIC PANEL
BUN/Creatinine Ratio: 14 (ref 10–24)
BUN: 14 mg/dL (ref 8–27)
CO2: 22 mmol/L (ref 20–29)
Calcium: 9.8 mg/dL (ref 8.6–10.2)
Chloride: 102 mmol/L (ref 96–106)
Creatinine, Ser: 1 mg/dL (ref 0.76–1.27)
GFR calc Af Amer: 87 mL/min/{1.73_m2} (ref >59)
GFR calc non Af Amer: 75 mL/min/{1.73_m2} (ref >59)
Glucose: 80 mg/dL (ref 65–99)
Potassium: 4.7 mmol/L (ref 3.5–5.2)
Sodium: 138 mmol/L (ref 134–144)

## 2019-05-14 DIAGNOSIS — Z20828 Contact with and (suspected) exposure to other viral communicable diseases: Secondary | ICD-10-CM | POA: Diagnosis not present

## 2019-06-04 DIAGNOSIS — Z0000 Encounter for general adult medical examination without abnormal findings: Secondary | ICD-10-CM | POA: Diagnosis not present

## 2019-06-04 DIAGNOSIS — I2510 Atherosclerotic heart disease of native coronary artery without angina pectoris: Secondary | ICD-10-CM | POA: Diagnosis not present

## 2019-06-04 DIAGNOSIS — I428 Other cardiomyopathies: Secondary | ICD-10-CM | POA: Diagnosis not present

## 2019-06-04 DIAGNOSIS — Z23 Encounter for immunization: Secondary | ICD-10-CM | POA: Diagnosis not present

## 2019-06-08 ENCOUNTER — Other Ambulatory Visit: Payer: Self-pay

## 2019-06-08 DIAGNOSIS — Z20822 Contact with and (suspected) exposure to covid-19: Secondary | ICD-10-CM

## 2019-06-09 LAB — NOVEL CORONAVIRUS, NAA: SARS-CoV-2, NAA: NOT DETECTED

## 2019-07-16 DIAGNOSIS — Z1152 Encounter for screening for COVID-19: Secondary | ICD-10-CM | POA: Diagnosis not present

## 2019-07-22 ENCOUNTER — Ambulatory Visit: Payer: Medicare Other | Attending: Internal Medicine

## 2019-07-22 ENCOUNTER — Ambulatory Visit: Payer: Medicare Other

## 2019-07-22 DIAGNOSIS — Z23 Encounter for immunization: Secondary | ICD-10-CM

## 2019-07-22 NOTE — Progress Notes (Signed)
   Covid-19 Vaccination Clinic  Name:  Frederick Goodwin    MRN: [id number] DOB: 12/25/1945  07/22/2019  [name] was observed post Covid-19 immunization for 15 minutes without incidence. He was provided with Vaccine Information Sheet and instruction to access the V-Safe system.   [name] was instructed to call 911 with any severe reactions post vaccine: Marland Kitchen Difficulty breathing  . Swelling of your face and throat  . A fast heartbeat  . A bad rash all over your body  . Dizziness and weakness    Immunizations Administered    Name Date Dose VIS Date Route   Pfizer COVID-19 Vaccine 07/22/2019  9:28 AM 0.3 mL 06/12/2019 Intramuscular   Manufacturer: [place]   Lot: [id number]   [place]: [id number]

## 2019-07-29 DIAGNOSIS — M545 Low back pain: Secondary | ICD-10-CM | POA: Diagnosis not present

## 2019-07-29 DIAGNOSIS — M25551 Pain in right hip: Secondary | ICD-10-CM | POA: Diagnosis not present

## 2019-07-29 DIAGNOSIS — M5136 Other intervertebral disc degeneration, lumbar region: Secondary | ICD-10-CM | POA: Diagnosis not present

## 2019-07-29 DIAGNOSIS — M47816 Spondylosis without myelopathy or radiculopathy, lumbar region: Secondary | ICD-10-CM | POA: Diagnosis not present

## 2019-08-09 ENCOUNTER — Ambulatory Visit: Payer: Medicare Other | Attending: Internal Medicine

## 2019-08-09 DIAGNOSIS — Z23 Encounter for immunization: Secondary | ICD-10-CM

## 2019-08-09 NOTE — Progress Notes (Signed)
   Covid-19 Vaccination Clinic  Name:  Frederick Goodwin    MRN: [id number] DOB: [birth year]  08/09/2019  [name] was observed post Covid-19 immunization for 15 minutes without incidence. He was provided with Vaccine Information Sheet and instruction to access the V-Safe system.   [name] was instructed to call 911 with any severe reactions post vaccine: Marland Kitchen Difficulty breathing  . Swelling of your face and throat  . A fast heartbeat  . A bad rash all over your body  . Dizziness and weakness    Immunizations Administered    Name Date Dose VIS Date Route   Pfizer COVID-19 Vaccine 08/09/2019 12:02 PM 0.3 mL 06/12/2019 Intramuscular   Manufacturer: [place]   Lot: [id number]   [place]: [id number]

## 2019-08-20 ENCOUNTER — Ambulatory Visit: Payer: Medicare Other

## 2019-10-14 ENCOUNTER — Other Ambulatory Visit: Payer: Self-pay | Admitting: Internal Medicine

## 2019-10-14 NOTE — Telephone Encounter (Signed)
LVM for patient to schedule.

## 2019-10-14 NOTE — Telephone Encounter (Signed)
Please schedule 6 month F/U with Dr. Saunders Revel. Thank you!

## 2019-10-16 ENCOUNTER — Encounter: Payer: Self-pay | Admitting: Family

## 2019-10-16 ENCOUNTER — Ambulatory Visit (INDEPENDENT_AMBULATORY_CARE_PROVIDER_SITE_OTHER): Payer: Medicare Other | Admitting: Family

## 2019-10-16 ENCOUNTER — Other Ambulatory Visit: Payer: Self-pay

## 2019-10-16 ENCOUNTER — Telehealth: Payer: Self-pay | Admitting: Family

## 2019-10-16 VITALS — BP 146/86 | HR 66 | Ht 72.0 in | Wt 225.1 lb

## 2019-10-16 DIAGNOSIS — E785 Hyperlipidemia, unspecified: Secondary | ICD-10-CM | POA: Diagnosis not present

## 2019-10-16 DIAGNOSIS — I10 Essential (primary) hypertension: Secondary | ICD-10-CM | POA: Diagnosis not present

## 2019-10-16 DIAGNOSIS — I428 Other cardiomyopathies: Secondary | ICD-10-CM | POA: Diagnosis not present

## 2019-10-16 DIAGNOSIS — I2510 Atherosclerotic heart disease of native coronary artery without angina pectoris: Secondary | ICD-10-CM | POA: Diagnosis not present

## 2019-10-16 MED ORDER — LISINOPRIL 10 MG PO TABS: 15.0000 mg | ORAL_TABLET | Freq: Every day | ORAL | 3 refills | Status: DC

## 2019-10-16 NOTE — Patient Instructions (Addendum)
Medication Instructions:  Your physician has recommended you make the following change in your medication:   Increase Lisinopril to 15mg  daily. Take 3 of your 5 mg tablets.   Send MyChart message in 1 week of blood pressure results.   Your physician has recommended you make the following change in your medication:   *If you need a refill on your cardiac medications before your next appointment, please call your pharmacy*  Lab Work: None today.  If you have labs (blood work) drawn today and your tests are completely normal, you will receive your results only by: Marland Kitchen MyChart Message (if you have MyChart) OR . A paper copy in the mail If you have any lab test that is abnormal or we need to change your treatment, we will call you to review the results.   Testing/Procedures: Your EKG today showed normal sinus rhythm with no acute changes. This is a good result.   Follow-Up: At [facility], you and your health needs are our priority.  As part of our continuing mission to provide you with exceptional heart care, we have created designated Provider Care Teams.  These Care Teams include your primary Cardiologist (physician) and Advanced Practice Providers (APPs -  Physician Assistants and Nurse Practitioners) who all work together to provide you with the care you need, when you need it.  We recommend signing up for the patient portal called "MyChart".  Sign up information is provided on this After Visit Summary.  MyChart is used to connect with patients for Virtual Visits (Telemedicine).  Patients are able to view lab/test results, encounter notes, upcoming appointments, etc.  Non-urgent messages can be sent to your provider as well.   To learn more about what you can do with MyChart, go to [email or web address].    Your next appointment:  In 6 months with Dr. Saunders Revel.   Call the clinic in 1 week with BP readings.  How to use a home blood pressure monitor. . Be still. Don't smoke, drink  caffeinated beverages or exercise within 30 minutes before measuring your blood pressure. . Sit correctly. Sit with your back straight and supported (on a dining chair, rather than a sofa). Your feet should be flat on the floor and your legs should not be crossed. Your arm should be supported on a flat surface (such as a table) with the upper arm at heart level. Make sure the bottom of the cuff is placed directly above the bend of the elbow.  . Measure at the same time every day. It's important to take the readings at the same time each day, such as morning and evening. Take reading approximately 1 hour after BP medications.

## 2019-10-16 NOTE — Progress Notes (Addendum)
Office Visit    Patient Name: Frederick Goodwin Date of Encounter: 10/16/2019  Primary Care Provider:  Orpah Melter, MD Primary [name], MD Electrophysiologist:  None   Chief Complaint    Frederick Goodwin is a 74 y.o. male with a hx of NICM in setting of frequent PVC, non-obstructive CAD with moderate prox LAD stenosis, PUD, ED, skin cancer presents today for follow up of NICM.    Past Medical History    Past Medical History:  Diagnosis Date  . Arrhythmia   . Arthritis   . Cancer ([place])   . Erectile dysfunction   . History of inguinal hernia repair    right  . Hx of ulcer disease    20 YRS AGO  . Mental disorder SKIN CANCER  . PVC (premature ventricular contraction)   . Snores    Past Surgical History:  Procedure Laterality Date  . HERNIA REPAIR     RIH as a child  . INGUINAL HERNIA REPAIR  08/15/2011   Procedure: LAPAROSCOPIC INGUINAL HERNIA;  Surgeon: Edward Jolly, MD;  Location: [facility];  Service: General;  Laterality: Right;  LAPAROSCOPIC RIGHT INGUINAL HERNIA Repair  . LEFT HEART CATH AND CORONARY ANGIOGRAPHY N/A 10/24/2017   Procedure: LEFT HEART CATH AND CORONARY ANGIOGRAPHY;  Surgeon: [name], MD;  Location: [facility];  Service: Cardiovascular;  Laterality: N/A;  . TONSILLECTOMY  1966    Allergies  No Known Allergies  History of Present Illness    Frederick Goodwin is a 74 y.o. male with a hx of NICM in setting of frequent PVC, non-obstructive CAD with moderate prox LAD stenosis, PUD, ED, skin cancer, HLD. He was last seen 04/22/19 by Dr. Saunders Revel.  Echo 09/2017 with LVEF 30-35% and diffuse hypokinesis, mild MR, mild LAE. LHC 09/2017 with proximal LAD 40% stenosis, OM1 50% stenosis, minimal diffuse disease. Follow-up echo 01/2018 with LVEF improved to 40-45$, gr1DD, mildly dilated ascending aorta.  At last visit his Lisionpril was increased to 24m for futher optimization of NICM therapies. He subsequently had cough and  it was decreased to 523mdaily. He was agreeable to try up-titration again and 05/2019 increased Lisinopril to 107m  Reports no chest pain, pressure, tightness. No SOB at rest. Reports DOE is stable at his baseline.   Does not add salt to his food. Eats mostly at home and does his own cooking. Walks for exercise about 3 miles a few times per week without dyspnea or chest pain. Stays active helping watch his grandchildren who are three, [id number] twelve years old.   Checked his blood pressure yesterday and was about what it was today. Does not check regularly, but agreeable to do so. We discussed goal blood pressure of less than 130/80.  EKGs/Labs/Other Studies Reviewed:   The following studies were reviewed today:  TTE (02/13/18): Normal LV size with mild LVH.  LVEF 40-45% with grade 1 diastolic dysfunction.  Mildly dilated ascending aorta (3.9 cm).  Mitral annular calcification.  Normal RV size and function.  TTE (10/15/17): Moderately dilated LV with normal wall thickness.  LVEF 30-35% with diffuse hypokinesis.  Elevated filling pressure noted.  MAC with mild MR.  Mild LAE.  LHC (10/24/17): LMCA normal.  Proximal LAD with 40% stenosis.  OM1 with 50% stenosis.  LCx otherwise normal.  Minimal diffuse disease involving RCA.  LVEDP 17 mmHg.  No aortic stenosis.    EKG:  EKG is ordered today.  The ekg  ordered today demonstrates sinus rhythm 66 bpm with left axis deviation and known LBBB. Stable compared to previous.  Recent Labs: 01/06/2019: ALT 28 05/07/2019: BUN 14; Creatinine, Ser 1.00; Potassium 4.7; Sodium 138  Recent Lipid Panel    Component Value Date/Time   CHOL 147 01/06/2019 0742   TRIG 100 01/06/2019 0742   HDL 61 01/06/2019 0742   CHOLHDL 2.4 01/06/2019 0742   LDLCALC 66 01/06/2019 0742    Home Medications   Current Meds  Medication Sig  . Ascorbic Acid (VITAMIN C) 1000 MG tablet Take 1,000 mg by mouth daily. Taking 1 tablet daily  . aspirin EC 81 MG tablet Take 1 tablet  (81 mg total) by mouth daily.  . carvedilol (COREG) 6.25 MG tablet TAKE ONE TABLET BY MOUTH TWICE A DAY  . Coenzyme Q10 (CO Q-10) 100 MG CAPS Take 1 capsule by mouth daily.  . fexofenadine (ALLEGRA) 180 MG tablet Take 180 mg by mouth daily as needed for allergies.   Marland Kitchen lisinopril (ZESTRIL) 10 MG tablet Take 1 tablet (10 mg total) by mouth daily.  . Multiple Vitamins-Minerals (ZINC PO) Take by mouth. Taking 1 tablet daily  . NON FORMULARY [facility]  ANTI-FUNGAL (NAIL)-#1  . omeprazole (PRILOSEC) 20 MG capsule Take 20 mg by mouth daily.   . rosuvastatin (CRESTOR) 10 MG tablet Take 1 tablet (10 mg total) by mouth daily.    Review of Systems      Review of Systems  Constitution: Negative for chills, fever and malaise/fatigue.  Cardiovascular: Negative for chest pain, dyspnea on exertion, leg swelling, near-syncope, orthopnea, palpitations and syncope.  Respiratory: Negative for cough, shortness of breath and wheezing.   Gastrointestinal: Negative for nausea and vomiting.  Neurological: Negative for dizziness, light-headedness and weakness.   All other systems reviewed and are otherwise negative except as noted above.  Physical Exam    VS:  BP (!) 146/86 (BP Location: Left Arm, Patient Position: Sitting, Cuff Size: Normal)   Pulse 66   Ht 6' (1.829 m)   Wt 225 lb 2 oz (102.1 kg)   SpO2 98%   BMI 30.53 kg/m  , BMI Body mass index is 30.53 kg/m. GEN: Well nourished, well developed, in no acute distress. HEENT: normal. Neck: Supple, no JVD, carotid bruits, or masses. Cardiac: RRR, no murmurs, rubs, or gallops. No clubbing, cyanosis, edema.  Radials/DP/PT 2+ and equal bilaterally.  Respiratory:  Respirations regular and unlabored, clear to auscultation bilaterally. GI: Soft, nontender, nondistended, BS + x 4. MS: No deformity or atrophy. Skin: Warm and dry, no rash. Neuro:  Strength and sensation are intact. Psych: Normal affect.  Accessory Clinical Findings    ECG  personally reviewed by me today -sinus rhythm 66 bpm with left axis deviation and known left bundle branch block- no acute changes.  Assessment & Plan    1. NICM - LVEF 40-45% by echo 01/2018. NYHA I. Continue GDMT beta blocker, ACE. No indication for diuretic at this time. Increase lisinopril, as below, for elevated blood pressure.  2. HLD, LDL goal <70 - Lipid panel 01/06/19 with LDL 66. Continue rosuvastatin 10 mg daily. Anticipate repeat lipid panel at next office visit.  3. Nonobstructive CAD -stable no anginal symptoms. EKG today with no acute ST/T wave changes. Continue GDMT including aspirin, beta-blocker, statin. Low-sodium, heart healthy diet recommended. Regular cardiovascular exercise encouraged.  4. HTN -blood pressure elevated today. Increase lisinopril from 10 mg to 15 mg. Will check in with blood pressure in 1 week to  assess further need to uptitrate to 20 mg.  Disposition: Follow up in 6 month(s) with Dr. Saunders Revel or APP   Loel Dubonnet, NP 10/16/2019, 2:38 PM

## 2019-10-16 NOTE — Telephone Encounter (Signed)
Returned call to pharmacy to verify new rx for lisinopril. No further questions or orders at this time.

## 2019-10-16 NOTE — Telephone Encounter (Signed)
Please call to discuss Lisinopril doseage

## 2019-10-22 ENCOUNTER — Encounter: Payer: Self-pay | Admitting: Family

## 2019-10-30 ENCOUNTER — Encounter: Payer: Self-pay | Admitting: Family

## 2019-11-02 MED ORDER — HYDROCHLOROTHIAZIDE 25 MG PO TABS: 25.0000 mg | ORAL_TABLET | Freq: Every day | ORAL | 2 refills | Status: DC

## 2019-11-02 NOTE — Addendum Note (Signed)
Addended by: Loel Dubonnet on: 11/02/2019 11:12 AM   Modules accepted: Orders

## 2019-11-03 ENCOUNTER — Other Ambulatory Visit: Payer: Self-pay

## 2019-11-03 DIAGNOSIS — I10 Essential (primary) hypertension: Secondary | ICD-10-CM

## 2019-11-03 DIAGNOSIS — Z79899 Other long term (current) drug therapy: Secondary | ICD-10-CM

## 2019-11-03 NOTE — Telephone Encounter (Signed)
I spoke with pt and scheduled him to have labs done at our [place] office on 5/18.Marland KitchenMarland Kitchen

## 2019-11-17 ENCOUNTER — Other Ambulatory Visit: Payer: Medicare Other

## 2019-11-17 DIAGNOSIS — I10 Essential (primary) hypertension: Secondary | ICD-10-CM | POA: Diagnosis not present

## 2019-11-17 DIAGNOSIS — Z79899 Other long term (current) drug therapy: Secondary | ICD-10-CM

## 2019-11-17 LAB — BASIC METABOLIC PANEL
BUN/Creatinine Ratio: 19 (ref 10–24)
BUN: 18 mg/dL (ref 8–27)
CO2: 24 mmol/L (ref 20–29)
Calcium: 9.5 mg/dL (ref 8.6–10.2)
Chloride: 94 mmol/L — ABNORMAL LOW (ref 96–106)
Creatinine, Ser: 0.97 mg/dL (ref 0.76–1.27)
GFR calc Af Amer: 90 mL/min/{1.73_m2} (ref >59)
GFR calc non Af Amer: 78 mL/min/{1.73_m2} (ref >59)
Glucose: 92 mg/dL (ref 65–99)
Potassium: 4.8 mmol/L (ref 3.5–5.2)
Sodium: 131 mmol/L — ABNORMAL LOW (ref 134–144)

## 2019-11-18 ENCOUNTER — Encounter: Payer: Self-pay | Admitting: Family

## 2019-11-18 ENCOUNTER — Telehealth: Payer: Self-pay

## 2019-11-18 DIAGNOSIS — I10 Essential (primary) hypertension: Secondary | ICD-10-CM

## 2019-11-18 NOTE — Telephone Encounter (Signed)
Patient is returning your call.  

## 2019-11-18 NOTE — Telephone Encounter (Signed)
Patient made aware of lab results and Laurann Montana, NP recommendation with verbalized understanding. [place] lab appt scheduled for 12/02/19.

## 2019-11-18 NOTE — Telephone Encounter (Signed)
Patient needs a 2 week bmet scheduled at the [facility] office. lmtcb Patient lab results have been released to mychart by Owens Shark, NP.

## 2019-11-18 NOTE — Telephone Encounter (Signed)
-----   Message from Loel Dubonnet, NP sent at 11/18/2019  8:39 AM EDT ----- Normal kidney function. Sodium mildly decreased. Would recommend repeat labs in 2 weeks. Continue HCTZ 25mg  daily.   (Of note, please assist to schedule labs at [place] office as he lives in [place]. Does require appt. MyChart message sent to check on his BP)

## 2019-12-02 ENCOUNTER — Other Ambulatory Visit: Payer: Self-pay

## 2019-12-02 ENCOUNTER — Encounter: Payer: Self-pay | Admitting: Family

## 2019-12-02 ENCOUNTER — Other Ambulatory Visit: Payer: Medicare Other | Admitting: *Deleted

## 2019-12-02 DIAGNOSIS — I10 Essential (primary) hypertension: Secondary | ICD-10-CM | POA: Diagnosis not present

## 2019-12-02 DIAGNOSIS — C4441 Basal cell carcinoma of skin of scalp and neck: Secondary | ICD-10-CM | POA: Diagnosis not present

## 2019-12-02 DIAGNOSIS — D485 Neoplasm of uncertain behavior of skin: Secondary | ICD-10-CM | POA: Diagnosis not present

## 2019-12-02 DIAGNOSIS — D225 Melanocytic nevi of trunk: Secondary | ICD-10-CM | POA: Diagnosis not present

## 2019-12-02 DIAGNOSIS — I428 Other cardiomyopathies: Secondary | ICD-10-CM

## 2019-12-02 DIAGNOSIS — Z85828 Personal history of other malignant neoplasm of skin: Secondary | ICD-10-CM | POA: Diagnosis not present

## 2019-12-02 DIAGNOSIS — C44519 Basal cell carcinoma of skin of other part of trunk: Secondary | ICD-10-CM | POA: Diagnosis not present

## 2019-12-02 DIAGNOSIS — L720 Epidermal cyst: Secondary | ICD-10-CM | POA: Diagnosis not present

## 2019-12-02 LAB — BASIC METABOLIC PANEL
BUN/Creatinine Ratio: 18 (ref 10–24)
BUN: 16 mg/dL (ref 8–27)
CO2: 22 mmol/L (ref 20–29)
Calcium: 9.9 mg/dL (ref 8.6–10.2)
Chloride: 94 mmol/L — ABNORMAL LOW (ref 96–106)
Creatinine, Ser: 0.9 mg/dL (ref 0.76–1.27)
GFR calc Af Amer: 98 mL/min/{1.73_m2} (ref >59)
GFR calc non Af Amer: 85 mL/min/{1.73_m2} (ref >59)
Glucose: 93 mg/dL (ref 65–99)
Potassium: 4.8 mmol/L (ref 3.5–5.2)
Sodium: 131 mmol/L — ABNORMAL LOW (ref 134–144)

## 2019-12-03 MED ORDER — LISINOPRIL 30 MG PO TABS: 30.0000 mg | ORAL_TABLET | Freq: Every day | ORAL | 2 refills | Status: DC

## 2019-12-03 MED ORDER — LISINOPRIL 10 MG PO TABS: 30.0000 mg | ORAL_TABLET | Freq: Every day | ORAL | 2 refills | Status: DC

## 2019-12-03 NOTE — Addendum Note (Signed)
Addended by: Loel Dubonnet on: 12/03/2019 11:32 AM   Modules accepted: Orders

## 2019-12-12 ENCOUNTER — Other Ambulatory Visit: Payer: Self-pay | Admitting: Internal Medicine

## 2019-12-28 DIAGNOSIS — Z0120 Encounter for dental examination and cleaning without abnormal findings: Secondary | ICD-10-CM | POA: Diagnosis not present

## 2019-12-31 DIAGNOSIS — L02229 Furuncle of trunk, unspecified: Secondary | ICD-10-CM | POA: Diagnosis not present

## 2020-01-21 ENCOUNTER — Other Ambulatory Visit: Payer: Self-pay | Admitting: Internal Medicine

## 2020-01-27 ENCOUNTER — Other Ambulatory Visit: Payer: Self-pay | Admitting: Family

## 2020-02-15 DIAGNOSIS — Z1211 Encounter for screening for malignant neoplasm of colon: Secondary | ICD-10-CM | POA: Diagnosis not present

## 2020-02-28 ENCOUNTER — Other Ambulatory Visit: Payer: Self-pay | Admitting: Family

## 2020-03-28 ENCOUNTER — Other Ambulatory Visit: Payer: Self-pay | Admitting: Family

## 2020-04-16 ENCOUNTER — Other Ambulatory Visit: Payer: Self-pay | Admitting: Internal Medicine

## 2020-04-20 ENCOUNTER — Other Ambulatory Visit: Payer: Self-pay

## 2020-04-20 ENCOUNTER — Encounter: Payer: Self-pay | Admitting: Internal Medicine

## 2020-04-20 ENCOUNTER — Ambulatory Visit: Payer: Medicare Other | Admitting: Internal Medicine

## 2020-04-20 VITALS — BP 124/74 | HR 65 | Ht 72.0 in | Wt 219.0 lb

## 2020-04-20 DIAGNOSIS — I10 Essential (primary) hypertension: Secondary | ICD-10-CM | POA: Diagnosis not present

## 2020-04-20 DIAGNOSIS — I428 Other cardiomyopathies: Secondary | ICD-10-CM

## 2020-04-20 DIAGNOSIS — I2510 Atherosclerotic heart disease of native coronary artery without angina pectoris: Secondary | ICD-10-CM

## 2020-04-20 DIAGNOSIS — I5022 Chronic systolic (congestive) heart failure: Secondary | ICD-10-CM

## 2020-04-20 DIAGNOSIS — E785 Hyperlipidemia, unspecified: Secondary | ICD-10-CM

## 2020-04-20 NOTE — Patient Instructions (Signed)
Medication Instructions:  Your physician recommends that you continue on your current medications as directed. Please refer to the Current Medication list given to you today.  *If you need a refill on your cardiac medications before your next appointment, please call your pharmacy*  Lab Work: Your physician recommends that you return for lab work in: TODAY - BMP.  If you have labs (blood work) drawn today and your tests are completely normal, you will receive your results only by: Marland Kitchen MyChart Message (if you have MyChart) OR . A paper copy in the mail If you have any lab test that is abnormal or we need to change your treatment, we will call you to review the results.  Testing/Procedures: none  Follow-Up: At [facility], you and your health needs are our priority.  As part of our continuing mission to provide you with exceptional heart care, we have created designated Provider Care Teams.  These Care Teams include your primary Cardiologist (physician) and Advanced Practice Providers (APPs -  Physician Assistants and Nurse Practitioners) who all work together to provide you with the care you need, when you need it.  We recommend signing up for the patient portal called "MyChart".  Sign up information is provided on this After Visit Summary.  MyChart is used to connect with patients for Virtual Visits (Telemedicine).  Patients are able to view lab/test results, encounter notes, upcoming appointments, etc.  Non-urgent messages can be sent to your provider as well.   To learn more about what you can do with MyChart, go to [email or web address].    Your next appointment:   6 month(s)  The format for your next appointment:   In Person  Provider:   You may see [name], MD or one of the following Advanced Practice Providers on your designated Care Team:    Murray Hodgkins, NP  Christell Faith, PA-C  Marrianne Mood, PA-C  Cadence Ophir, [place]

## 2020-04-20 NOTE — Progress Notes (Signed)
Follow-up Outpatient Visit Date: 04/20/2020  Primary Care Provider: Orpah Melter, MD [address] [place] [address]  Chief Complaint: Follow-up cardiomyopathy, PVC's, and coronary artery disease  HPI:  [name] is a 74 y.o. male with history of nonischemic cardiomyopathy in the setting of frequent PVCs, nonobstructive coronary artery disease with moderate proximal LAD stenosis, peptic ulcer disease, erectile dysfunction, and skin cancer, who presents for follow-up of nonischemic cardiomyopathy.  He was last seen in our office in April by Laurann Montana, NP, at which time he reported stable exertional dyspnea.  Lisinopril has subsequently been escalated to 30 mg daily to improve blood pressure control.  Tolerating medicationToday, [name] reports that he is feeling quite well.  He has occasional lightheadedness that is transient.  He denies chest pain, shortness of breath, palpitations, and edema.  He tries to walk 2 miles a day.  He is tolerating his medications well.  --------------------------------------------------------------------------------------------------  Cardiovascular History & Procedures: Cardiovascular Problems:  PVCs  NICM  Non-obstructive CAD  Risk Factors:  Age greater than 39 and male gender  Cath/PCI:  LHC (10/24/17): LMCA normal. Proximal LAD with 40% stenosis. OM1 with 50% stenosis. LCx otherwise normal. Minimal diffuse disease involving RCA. LVEDP 17 mmHg. No aortic stenosis.  CV Surgery:  None  EP Procedures and Devices:  None  Non-Invasive Evaluation(s):  TTE (02/13/18): Normal LV size with mild LVH.  LVEF 40-45% with grade 1 diastolic dysfunction.  Mildly dilated ascending aorta (3.9 cm).  Mitral annular calcification.  Normal RV size and function.  TTE (10/15/17): Moderately dilated LV with normal wall thickness. LVEF 30-35% with diffuse hypokinesis. Elevated filling pressure noted. MAC with mild MR. Mild  LAE.  Recent CV Pertinent Labs: Lab Results  Component Value Date   CHOL 147 01/06/2019   HDL 61 01/06/2019   LDLCALC 66 01/06/2019   TRIG 100 01/06/2019   CHOLHDL 2.4 01/06/2019   INR 1.1 10/18/2017   K 4.1 04/20/2020   MG 2.3 10/07/2017   BUN 20 04/20/2020   CREATININE 0.90 04/20/2020    Past medical and surgical history were reviewed and updated in EPIC.  Current Meds  Medication Sig  . Ascorbic Acid (VITAMIN C) 1000 MG tablet Take 1,000 mg by mouth daily. Taking 1 tablet daily  . aspirin EC 81 MG tablet Take 1 tablet (81 mg total) by mouth daily.  . carvedilol (COREG) 6.25 MG tablet TAKE ONE TABLET BY MOUTH TWICE A DAY  . Coenzyme Q10 (CO Q-10) 100 MG CAPS Take 1 capsule by mouth daily.  . hydrochlorothiazide (HYDRODIURIL) 25 MG tablet TAKE ONE TABLET BY MOUTH DAILY  . lisinopril (ZESTRIL) 30 MG tablet TAKE ONE TABLET BY MOUTH DAILY  . Multiple Vitamins-Minerals (ZINC PO) Take by mouth. Taking 1 tablet daily  . NON FORMULARY [facility]  ANTI-FUNGAL (NAIL)-#1  . omeprazole (PRILOSEC) 20 MG capsule Take 20 mg by mouth daily.   . rosuvastatin (CRESTOR) 10 MG tablet TAKE ONE TABLET BY MOUTH DAILY    Allergies: Patient has no known allergies.  Social History   Tobacco Use  . Smoking status: Former Smoker    Packs/day: 1.00    Years: 30.00    Pack years: 30.00    Types: Cigarettes    Quit date: 1999    Years since quitting: 22.8  . Smokeless tobacco: Never Used  Vaping Use  . Vaping Use: Never used  Substance Use Topics  . Alcohol use: Yes    Alcohol/week: 8.0 standard  drinks    Types: 1 Standard drinks or equivalent, 7 Glasses of wine per week  . Drug use: No    Family History  Problem Relation Age of Onset  . Heart disease Mother        Atrial fibrillation  . COPD Mother   . Cancer Father        leukemia    Review of Systems: A 12-system review of systems was performed and was negative except as noted in the  HPI.  --------------------------------------------------------------------------------------------------  Physical Exam: BP 124/74 (BP Location: Left Arm, Patient Position: Sitting, Cuff Size: Normal)   Pulse 65   Ht 6' (1.829 m)   Wt 219 lb (99.3 kg)   SpO2 97%   BMI 29.70 kg/m   General: NAD. Neck: No JVD or HJR. Lungs: Clear to auscultation bilaterally without wheezes or crackles. Heart: Regular rate and rhythm without murmurs, rubs, or gallops. Abdomen: Soft, nontender, nondistended. Extremities: No lower extremity edema.  EKG: Normal sinus rhythm with isolated PVC, left axis deviation, and nonspecific intraventricular conduction delay.  With prior tracing from 10/29/2019, PVC is now present.  Otherwise, no significant interval change.  Lab Results  Component Value Date   WBC 4.8 10/18/2017   HGB 15.5 10/18/2017   HCT 44.5 10/18/2017   MCV 94 10/18/2017   PLT 196 10/18/2017    Lab Results  Component Value Date   NA 127 (L) 04/20/2020   K 4.1 04/20/2020   CL 91 (L) 04/20/2020   CO2 22 04/20/2020   BUN 20 04/20/2020   CREATININE 0.90 04/20/2020   GLUCOSE 120 (H) 04/20/2020   ALT 28 01/06/2019    Lab Results  Component Value Date   CHOL 147 01/06/2019   HDL 61 01/06/2019   LDLCALC 66 01/06/2019   TRIG 100 01/06/2019   CHOLHDL 2.4 01/06/2019    --------------------------------------------------------------------------------------------------  ASSESSMENT AND PLAN: Chronic HFrEF secondary to nonischemic cardiomyopathy: [name] appears euvolemic with NYHA class I symptoms.  We will plan to continue his current regimen of goal-directed medical therapy consisting of carvedilol 6.25 mg twice daily and lisinopril 30 mg daily.  I will check a basic metabolic panel today given escalation of lisinopril over the last few months.  Nonobstructive coronary artery disease: No symptoms to suggest worsening coronary insufficiency.  We will plan to continue aspirin and  statin therapy.  [name] reports he is due for his physical with labs in December through his PCP.  PVCs: Single PVC noted on EKG today.  [name] denies palpitations.  Continue current dose of carvedilol.  We will check a BMP to ensure electrolytes.  Hypertension: Blood pressure well controlled today.  Continue current regimen of carvedilol, HCTZ, and lisinopril.  I will check a basic metabolic panel today to ensure stable renal function and electrolytes.  Mild hyponatremia has been noted in the past.  If this is worsened, we will need to consider discontinuing HCTZ.  Hyperlipidemia: Continue rosuvastatin 10 mg daily for target LDL less than 70.  Lipid panel to be drawn when [name] is seen by his PCP in December.  Follow-up: Return to clinic in 6 months.  [name], MD 04/21/2020 7:53 AM

## 2020-04-21 ENCOUNTER — Encounter: Payer: Self-pay | Admitting: Internal Medicine

## 2020-04-21 ENCOUNTER — Telehealth: Payer: Self-pay | Admitting: *Deleted

## 2020-04-21 DIAGNOSIS — I5022 Chronic systolic (congestive) heart failure: Secondary | ICD-10-CM | POA: Insufficient documentation

## 2020-04-21 LAB — BASIC METABOLIC PANEL
BUN/Creatinine Ratio: 22 (ref 10–24)
BUN: 20 mg/dL (ref 8–27)
CO2: 22 mmol/L (ref 20–29)
Calcium: 9.4 mg/dL (ref 8.6–10.2)
Chloride: 91 mmol/L — ABNORMAL LOW (ref 96–106)
Creatinine, Ser: 0.9 mg/dL (ref 0.76–1.27)
GFR calc Af Amer: 98 mL/min/{1.73_m2} (ref >59)
GFR calc non Af Amer: 85 mL/min/{1.73_m2} (ref >59)
Glucose: 120 mg/dL — ABNORMAL HIGH (ref 65–99)
Potassium: 4.1 mmol/L (ref 3.5–5.2)
Sodium: 127 mmol/L — ABNORMAL LOW (ref 134–144)

## 2020-04-21 NOTE — Telephone Encounter (Signed)
No answer and VM full on patient's number. No answer. Left message to call back on wife's number.

## 2020-04-21 NOTE — Telephone Encounter (Signed)
-----   Message from [name], MD sent at 04/21/2020  7:59 AM EDT ----- Please let Frederick Goodwin know that his kidney function and potassium have remained normal.  His sodium has gradually decreased over the last 6 months.  This can be associated with HCTZ use.  I recommend that he stop taking HCTZ and have a repeat basic metabolic panel drawn in about a month at the [facility].  He should monitor his blood pressure at home and let us know if it is consistently above 140/90.

## 2020-04-25 NOTE — Telephone Encounter (Signed)
See MyChart messages for further details. Patient is aware.

## 2020-06-09 DIAGNOSIS — K219 Gastro-esophageal reflux disease without esophagitis: Secondary | ICD-10-CM | POA: Diagnosis not present

## 2020-06-09 DIAGNOSIS — E7800 Pure hypercholesterolemia, unspecified: Secondary | ICD-10-CM | POA: Diagnosis not present

## 2020-06-09 DIAGNOSIS — I10 Essential (primary) hypertension: Secondary | ICD-10-CM | POA: Diagnosis not present

## 2020-06-09 DIAGNOSIS — Z0000 Encounter for general adult medical examination without abnormal findings: Secondary | ICD-10-CM | POA: Diagnosis not present

## 2020-06-12 DIAGNOSIS — Z1211 Encounter for screening for malignant neoplasm of colon: Secondary | ICD-10-CM | POA: Diagnosis not present

## 2020-06-21 ENCOUNTER — Other Ambulatory Visit: Payer: Self-pay | Admitting: *Deleted

## 2020-06-21 MED ORDER — AMLODIPINE BESYLATE 5 MG PO TABS: 5.0000 mg | ORAL_TABLET | Freq: Every day | ORAL | 2 refills | Status: DC

## 2020-06-21 MED ORDER — LISINOPRIL 40 MG PO TABS
40.0000 mg | ORAL_TABLET | Freq: Every day | ORAL | 2 refills | Status: DC
Start: 2020-06-21 — End: 2021-02-23

## 2020-06-21 NOTE — Telephone Encounter (Signed)
See mychart msg for htn .  Patient called office but declined at this time to schedule a visit .    Patient wants to speak with nurse.

## 2020-06-21 NOTE — Telephone Encounter (Signed)
Spoke with patient and he reports his son-in-law is a physician and when his blood pressures went up yesterday he took additional lisinopril in order to try and control it better. Reviewed medications, confirmed doses, and frequency. He reports these readings were after medications as well. Placed appointment on hold for him tomorrow with Angelica Ran NP but will check with APP that saw him last. Instructed him to continue monitoring blood pressures and keep a log of his readings. Reviewed signs and symptoms to call 911 if he should have slurred speech and one sided weakness. He was agreeable with this plan with no further questions at this time.

## 2020-06-22 ENCOUNTER — Telehealth: Payer: Self-pay | Admitting: Internal Medicine

## 2020-06-22 DIAGNOSIS — I5022 Chronic systolic (congestive) heart failure: Secondary | ICD-10-CM

## 2020-06-22 DIAGNOSIS — Z79899 Other long term (current) drug therapy: Secondary | ICD-10-CM

## 2020-06-22 NOTE — Telephone Encounter (Signed)
Patient is calling to see when Dr. Saunders Revel is going to place order for his labs. Please advise.

## 2020-06-22 NOTE — Telephone Encounter (Signed)
Called and notified pt I have placed lab orders for Bmet. He will call the [facility] office to schedule appt for lab work.

## 2020-06-27 ENCOUNTER — Encounter: Payer: Self-pay | Admitting: Family

## 2020-06-27 ENCOUNTER — Other Ambulatory Visit: Payer: Medicare Other | Admitting: *Deleted

## 2020-06-27 ENCOUNTER — Other Ambulatory Visit: Payer: Self-pay

## 2020-06-27 ENCOUNTER — Other Ambulatory Visit: Payer: Medicare Other

## 2020-06-27 DIAGNOSIS — Z79899 Other long term (current) drug therapy: Secondary | ICD-10-CM

## 2020-06-27 DIAGNOSIS — I5022 Chronic systolic (congestive) heart failure: Secondary | ICD-10-CM

## 2020-06-27 LAB — BASIC METABOLIC PANEL
BUN/Creatinine Ratio: 15 (ref 10–24)
BUN: 13 mg/dL (ref 8–27)
CO2: 22 mmol/L (ref 20–29)
Calcium: 9.5 mg/dL (ref 8.6–10.2)
Chloride: 97 mmol/L (ref 96–106)
Creatinine, Ser: 0.84 mg/dL (ref 0.76–1.27)
GFR calc Af Amer: 101 mL/min/{1.73_m2} (ref >59)
GFR calc non Af Amer: 87 mL/min/{1.73_m2} (ref >59)
Glucose: 85 mg/dL (ref 65–99)
Potassium: 4.3 mmol/L (ref 3.5–5.2)
Sodium: 136 mmol/L (ref 134–144)

## 2020-06-28 ENCOUNTER — Telehealth: Payer: Self-pay | Admitting: *Deleted

## 2020-06-28 NOTE — Telephone Encounter (Signed)
Attempted to call pt with lab results. No answer. LMOM TCB.  

## 2020-06-28 NOTE — Telephone Encounter (Signed)
Patient returning call.

## 2020-06-28 NOTE — Telephone Encounter (Signed)
-----   Message from Yvonne Kendall, MD sent at 06/28/2020 12:45 PM EST ----- Normal BMP with improved sodium.  Continue current medications and f/u as previously arranged.

## 2020-06-28 NOTE — Telephone Encounter (Signed)
Reviewed results with patient and he did inquire about appointment as well. Scheduled for 07/13/19 at 11:00 AM per APP My Chart response. He verbalized understanding of results, confirmed appointment, and had no further questions at this time.

## 2020-07-11 NOTE — Progress Notes (Signed)
Office Visit    Patient Name: Frederick Goodwin Date of Encounter: 07/12/2020  Primary Care Provider:  Antony Contras, MD Primary [name], MD Electrophysiologist:  None   Chief Complaint    Frederick Goodwin is a 75 y.o. male with a hx of NICM in setting of frequent PVC, non-obstructive CAD with moderate prox LAD stenosis, PUD, ED, skin cancer presents today for follow up of NICM.    Past Medical History    Past Medical History:  Diagnosis Date  . Arrhythmia   . Arthritis   . Cancer ([place])   . Erectile dysfunction   . History of inguinal hernia repair    right  . Hx of ulcer disease    20 YRS AGO  . Mental disorder SKIN CANCER  . PVC (premature ventricular contraction)   . Snores    Past Surgical History:  Procedure Laterality Date  . HERNIA REPAIR     RIH as a child  . INGUINAL HERNIA REPAIR  08/15/2011   Procedure: LAPAROSCOPIC INGUINAL HERNIA;  Surgeon: Edward Jolly, MD;  Location: [facility];  Service: General;  Laterality: Right;  LAPAROSCOPIC RIGHT INGUINAL HERNIA Repair  . LEFT HEART CATH AND CORONARY ANGIOGRAPHY N/A 10/24/2017   Procedure: LEFT HEART CATH AND CORONARY ANGIOGRAPHY;  Surgeon: [name], MD;  Location: [facility];  Service: Cardiovascular;  Laterality: N/A;  . TONSILLECTOMY  1966    Allergies  No Known Allergies  History of Present Illness    Frederick Goodwin is a 75 y.o. male with a hx of NICM in setting of frequent PVC, non-obstructive CAD with moderate prox LAD stenosis, PUD, ED, skin cancer, HLD. He was last seen 04/22/19 by Dr. Saunders Revel.  Echo 09/2017 with LVEF 30-35% and diffuse hypokinesis, mild MR, mild LAE. LHC 09/2017 with proximal LAD 40% stenosis, OM1 50% stenosis, minimal diffuse disease. Follow-up echo 01/2018 with LVEF improved to 40-45$, gr1DD, mildly dilated ascending aorta.  Seen in follow up 04/20/20. Due to mild hyponatremia he was recommended to discontinue HCTZ. Due to persistently elevated BP  readings at home, his Lisinopril was increased to 47m daily and Amlodipine 559m then 1050mdded.   Presents today for follow up. As his BP was persistently elevated despite escalation of antihypertensive regimen recommended to check BP cuff in clinic. He brings 2 arm blood pressure cuffs for review, one is [employer] and the other [facility].   Manual 136/78 Home cuff [employer]ron: 146/93 Home cuff [facility]: 164/89 Home cuff [facility]: 148/92  Reports no shortness of breath nor dyspnea on exertion. Reports no chest pain, pressure, or tightness. No edema, orthopnea, PND. Reports no palpitations.   EKGs/Labs/Other Studies Reviewed:   The following studies were reviewed today:  TTE (02/13/18): Normal LV size with mild LVH.  LVEF 40-45% with grade 1 diastolic dysfunction.  Mildly dilated ascending aorta (3.9 cm).  Mitral annular calcification.  Normal RV size and function.  TTE (10/15/17): Moderately dilated LV with normal wall thickness.  LVEF 30-35% with diffuse hypokinesis.  Elevated filling pressure noted.  MAC with mild MR.  Mild LAE.  LHC (10/24/17): LMCA normal.  Proximal LAD with 40% stenosis.  OM1 with 50% stenosis.  LCx otherwise normal.  Minimal diffuse disease involving RCA.  LVEDP 17 mmHg.  No aortic stenosis.   EKG:  No EKG is ordered today.    Recent Labs: 06/27/2020: BUN 13; Creatinine, Ser 0.84; Potassium 4.3; Sodium 136  Recent Lipid Panel    Component  Value Date/Time   CHOL 147 01/06/2019 0742   TRIG 100 01/06/2019 0742   HDL 61 01/06/2019 0742   CHOLHDL 2.4 01/06/2019 0742   LDLCALC 66 01/06/2019 0742    Home Medications   Current Meds  Medication Sig  . amLODipine (NORVASC) 10 MG tablet Take 10 mg by mouth daily.  . Ascorbic Acid (VITAMIN C) 1000 MG tablet Take 1,000 mg by mouth daily. Taking 1 tablet daily  . aspirin EC 81 MG tablet Take 1 tablet (81 mg total) by mouth daily.  . carvedilol (COREG) 6.25 MG tablet TAKE ONE TABLET BY MOUTH TWICE A DAY  . Coenzyme Q10 (CO  Q-10) 100 MG CAPS Take 1 capsule by mouth daily.  Marland Kitchen lisinopril (ZESTRIL) 40 MG tablet Take 1 tablet (40 mg total) by mouth daily.  . Multiple Vitamins-Minerals (ZINC PO) Take by mouth. Taking 1 tablet daily  . NON FORMULARY [facility]  ANTI-FUNGAL (NAIL)-#1  . omeprazole (PRILOSEC) 20 MG capsule Take 20 mg by mouth daily.   . rosuvastatin (CRESTOR) 10 MG tablet TAKE ONE TABLET BY MOUTH DAILY    Review of Systems   All other systems reviewed and are otherwise negative except as noted above.  Physical Exam    VS:  BP 136/72 (BP Location: Left Arm, Patient Position: Sitting, Cuff Size: Normal)   Pulse 74   Ht 6' (1.829 m)   Wt 219 lb 8 oz (99.6 kg)   SpO2 98%   BMI 29.77 kg/m  , BMI Body mass index is 29.77 kg/m. GEN: Well nourished, well developed, in no acute distress. HEENT: normal. Neck: Supple, no JVD, carotid bruits, or masses. Cardiac: RRR, no murmurs, rubs, or gallops. No clubbing, cyanosis, edema.  Radials/DP/PT 2+ and equal bilaterally.  Respiratory:  Respirations regular and unlabored, clear to auscultation bilaterally. GI: Soft, nontender, nondistended, BS + x 4. MS: No deformity or atrophy. Skin: Warm and dry, no rash. Neuro:  Strength and sensation are intact. Psych: Normal affect.  Assessment & Plan    1. NICM - LVEF 40-45% by echo 01/2018. NYHA I. Continue GDMT beta blocker, ACE. No indication for diuretic at this time.   2. HLD, LDL goal <70 -  Continue rosuvastatin 10 mg daily.   3. Nonobstructive CAD - Stable with no anginal symptoms. Continue GDMT including aspirin, beta-blocker, statin. Low-sodium, heart healthy diet recommended. Regular cardiovascular exercise encouraged.  4. HTN - BP reasonably controlled with clinic reading 136/78. Continue regimen including Coreg 6.30m BID, Amlodipine 125mdaily, Lisinopril 4040maily. His home [facility] cuff which he was using routinely was found to be inaccurate with readings up to 30 points higher than  manual reading. He will use his home Omron cuff which is only 10 points higher systolic. Continue to monitor BP at home.   Disposition: Follow up in 4-6 month(s) with Dr. EndSaunders Revel APP   CaiLoel DubonnetP 07/12/2020, 10:58 AM

## 2020-07-12 ENCOUNTER — Ambulatory Visit: Payer: Medicare Other | Admitting: Family

## 2020-07-12 ENCOUNTER — Other Ambulatory Visit: Payer: Self-pay

## 2020-07-12 ENCOUNTER — Encounter: Payer: Self-pay | Admitting: Family

## 2020-07-12 VITALS — BP 136/72 | HR 74 | Ht 72.0 in | Wt 219.5 lb

## 2020-07-12 DIAGNOSIS — I10 Essential (primary) hypertension: Secondary | ICD-10-CM

## 2020-07-12 DIAGNOSIS — E782 Mixed hyperlipidemia: Secondary | ICD-10-CM

## 2020-07-12 DIAGNOSIS — I428 Other cardiomyopathies: Secondary | ICD-10-CM

## 2020-07-12 DIAGNOSIS — I2510 Atherosclerotic heart disease of native coronary artery without angina pectoris: Secondary | ICD-10-CM | POA: Diagnosis not present

## 2020-07-12 MED ORDER — AMLODIPINE BESYLATE 10 MG PO TABS: 10.0000 mg | ORAL_TABLET | Freq: Every day | ORAL | 2 refills | Status: DC

## 2020-07-12 NOTE — Patient Instructions (Addendum)
Medication Instructions:  None ordered today.   *If you need a refill on your cardiac medications before your next appointment, please call your pharmacy*  Lab Work None ordered today.   Testing/Procedures: None ordered today.   Follow-Up: At [facility], you and your health needs are our priority.  As part of our continuing mission to provide you with exceptional heart care, we have created designated Provider Care Teams.  These Care Teams include your primary Cardiologist (physician) and Advanced Practice Providers (APPs -  Physician Assistants and Nurse Practitioners) who all work together to provide you with the care you need, when you need it.   Your next appointment:   4-6 month(s)  The format for your next appointment:   In Person  Provider:   You may see [name], MD or one of the following Advanced Practice Providers on your designated Care Team:    Murray Hodgkins, NP  Christell Faith, PA-C  Marrianne Mood, PA-C  Cadence Kathlen Mody, [place]  Laurann Montana, NP  Other Instructions  Manual blood pressure: 136/78 Home Omron cuff: 146/93  When checking at home:  Focus on systolic blood pressure (top number)  Subtract 10 from your reading on your machine  Our goal is for your systolic (top number) to be around 130 (after you have subtracted 10!)

## 2020-07-15 ENCOUNTER — Other Ambulatory Visit: Payer: Self-pay | Admitting: Internal Medicine

## 2020-07-24 ENCOUNTER — Other Ambulatory Visit: Payer: Self-pay | Admitting: Family

## 2020-08-03 DIAGNOSIS — M7918 Myalgia, other site: Secondary | ICD-10-CM | POA: Diagnosis not present

## 2020-08-03 DIAGNOSIS — M48062 Spinal stenosis, lumbar region with neurogenic claudication: Secondary | ICD-10-CM | POA: Diagnosis not present

## 2020-08-03 DIAGNOSIS — M47816 Spondylosis without myelopathy or radiculopathy, lumbar region: Secondary | ICD-10-CM | POA: Diagnosis not present

## 2020-08-03 DIAGNOSIS — M7061 Trochanteric bursitis, right hip: Secondary | ICD-10-CM | POA: Diagnosis not present

## 2020-10-10 DIAGNOSIS — J0110 Acute frontal sinusitis, unspecified: Secondary | ICD-10-CM | POA: Diagnosis not present

## 2020-10-14 DIAGNOSIS — J329 Chronic sinusitis, unspecified: Secondary | ICD-10-CM | POA: Diagnosis not present

## 2020-10-14 DIAGNOSIS — J40 Bronchitis, not specified as acute or chronic: Secondary | ICD-10-CM | POA: Diagnosis not present

## 2020-10-21 ENCOUNTER — Other Ambulatory Visit: Payer: Self-pay | Admitting: Internal Medicine

## 2020-11-18 ENCOUNTER — Ambulatory Visit: Payer: Medicare Other | Admitting: Internal Medicine

## 2020-11-18 NOTE — Progress Notes (Deleted)
Follow-up Outpatient Visit Date: 11/18/2020  Primary Care Provider: Antony Contras, MD [address] [place] [address]  Chief Complaint: ***  HPI:  Mr. Grotz is a 75 y.o. male with history of nonischemic cardiomyopathy in the setting of frequent PVCs, nonobstructive coronary artery disease with moderate proximal LAD stenosis, peptic ulcer disease, erectile dysfunction, and skin cancer, who presents for follow-up of cardiomyopathy and PVCs.  He was last seen in our office by Laurann Montana, NP, in January.  He was feeling well at that time but noted persistent blood pressure elevations at home.  Blood pressure was upper normal in our office; it was suspected that one of his home blood pressure cuffs was giving artificially high readings.  No medication changes were made.  --------------------------------------------------------------------------------------------------  Cardiovascular History & Procedures: Cardiovascular Problems:  PVCs  NICM  Non-obstructive CAD  Risk Factors:  Age greater than 76 and male gender  Cath/PCI:  LHC (10/24/17): LMCA normal. Proximal LAD with 40% stenosis. OM1 with 50% stenosis. LCx otherwise normal. Minimal diffuse disease involving RCA. LVEDP 17 mmHg. No aortic stenosis.  CV Surgery:  None  EP Procedures and Devices:  None  Non-Invasive Evaluation(s):  TTE (02/13/18): Normal LV size with mild LVH. LVEF 40-45% with grade 1 diastolic dysfunction. Mildly dilated ascending aorta (3.9 cm). Mitral annular calcification. Normal RV size and function.  TTE (10/15/17): Moderately dilated LV with normal wall thickness. LVEF 30-35% with diffuse hypokinesis. Elevated filling pressure noted. MAC with mild MR. Mild LAE.   Recent CV Pertinent Labs: Lab Results  Component Value Date   CHOL 147 01/06/2019   HDL 61 01/06/2019   LDLCALC 66 01/06/2019   TRIG 100 01/06/2019   CHOLHDL 2.4 01/06/2019   INR 1.1  10/18/2017   K 4.3 06/27/2020   MG 2.3 10/07/2017   BUN 13 06/27/2020   CREATININE 0.84 06/27/2020    Past medical and surgical history were reviewed and updated in EPIC.  No outpatient medications have been marked as taking for the 11/18/20 encounter (Appointment) with Antino Mayabb, Harrell Gave, MD.    Allergies: Patient has no known allergies.  Social History   Tobacco Use  . Smoking status: Former Smoker    Packs/day: 1.00    Years: 30.00    Pack years: 30.00    Types: Cigarettes    Quit date: 1999    Years since quitting: 23.3  . Smokeless tobacco: Never Used  Vaping Use  . Vaping Use: Never used  Substance Use Topics  . Alcohol use: Yes    Alcohol/week: 8.0 standard drinks    Types: 1 Standard drinks or equivalent, 7 Glasses of wine per week  . Drug use: No    Family History  Problem Relation Age of Onset  . Heart disease Mother        Atrial fibrillation  . COPD Mother   . Cancer Father        leukemia    Review of Systems: A 12-system review of systems was performed and was negative except as noted in the HPI.  --------------------------------------------------------------------------------------------------  Physical Exam: There were no vitals taken for this visit.  General:  NAD. Neck: No JVD or HJR. Lungs: Clear to auscultation bilaterally without wheezes or crackles. Heart: Regular rate and rhythm without murmurs, rubs, or gallops. Abdomen: Soft, nontender, nondistended. Extremities: No lower extremity edema.  EKG:  ***  Lab Results  Component Value Date   WBC 4.8 10/18/2017   HGB 15.5 10/18/2017   HCT  44.5 10/18/2017   MCV 94 10/18/2017   PLT 196 10/18/2017    Lab Results  Component Value Date   NA 136 06/27/2020   K 4.3 06/27/2020   CL 97 06/27/2020   CO2 22 06/27/2020   BUN 13 06/27/2020   CREATININE 0.84 06/27/2020   GLUCOSE 85 06/27/2020   ALT 28 01/06/2019    Lab Results  Component Value Date   CHOL 147 01/06/2019   HDL 61  01/06/2019   LDLCALC 66 01/06/2019   TRIG 100 01/06/2019   CHOLHDL 2.4 01/06/2019    --------------------------------------------------------------------------------------------------  ASSESSMENT AND PLAN: ***  [name], MD 11/18/2020 7:21 AM

## 2020-11-23 ENCOUNTER — Ambulatory Visit: Payer: Medicare Other | Admitting: Internal Medicine

## 2020-11-25 ENCOUNTER — Ambulatory Visit: Payer: Medicare Other | Admitting: Physician Assistant

## 2020-12-01 DIAGNOSIS — D485 Neoplasm of uncertain behavior of skin: Secondary | ICD-10-CM | POA: Diagnosis not present

## 2020-12-01 DIAGNOSIS — D045 Carcinoma in situ of skin of trunk: Secondary | ICD-10-CM | POA: Diagnosis not present

## 2020-12-01 DIAGNOSIS — Z85828 Personal history of other malignant neoplasm of skin: Secondary | ICD-10-CM | POA: Diagnosis not present

## 2020-12-01 DIAGNOSIS — D225 Melanocytic nevi of trunk: Secondary | ICD-10-CM | POA: Diagnosis not present

## 2020-12-01 DIAGNOSIS — L821 Other seborrheic keratosis: Secondary | ICD-10-CM | POA: Diagnosis not present

## 2020-12-06 NOTE — Progress Notes (Signed)
Cardiology Office Note    Date:  12/07/2020   ID:  Frederick Goodwin, DOB 1945/12/18, MRN [id number]  [name], MD  [name], MD  Electrophysiologist:  None   Chief Complaint: Follow-up  History of Present Illness:   Frederick Goodwin is a 75 y.o. male with history of nonobstructive CAD, HFrEF secondary to NICM, frequent PVCs, HTN, HLD, PUD, ED, and skin cancer who presents for follow-up of his cardiomyopathy.  Echo in 09/2017 showed an EF of 30 to 35% with diffuse hypokinesis, mild mitral regurgitation, and mild left atrial enlargement.  Diagnostic LHC in 09/2017 showed nonobstructive CAD with proximal LAD 40% stenosis, OM1 50% stenosis, and minimal luminal irregularities involving the RCA.  Findings were consistent with nonischemic cardiomyopathy.  Following escalation of GDMT, repeat echo in 01/2018 showed an improvement in his LVEF at 40 to 80%, grade 1 diastolic dysfunction, and a mildly dilated ascending aorta.  HCTZ has previously been discontinued in the setting of mild hyponatremia.  He was last seen in the office in 07/2020 noting persistently elevated BP despite escalation of antihypertensive therapy.  Upon comparing 1 of his home BP cuff's to the office's, it was found his home BP cuff was inaccurate with readings up to 30 mmHg higher than the manual reading.  His Omron BP cuff was relatively consistent with office readings.  No medication changes were made.  He comes in cardiac perspective.  No chest pain, dyspnea, palpitations, dizziness, presyncope, or syncope.  He does note a several month history of bilateral swelling and is uncertain if this waxes/wanes throughout the day.  His weight is down 4 pounds when compared to his last office visit in 07/2020, and he feels like his weight has been stable at home.  He sleeps on 1 pillow, denies any PND or early satiety.  His blood pressure remains well controlled on his current regimen.  He is watching his sodium and  fluid intake.  Outside of his ankle swelling he does not have any issues or concerns at this time and feels like he is doing well.   Labs independently reviewed: 06/2020 - BUN 17, serum creatinine 0.82, potassium 4.7, albumin 4.3, AST/ALT normal Hgb 14, PLT 195, HDL 59, LDL 89, TSH normal   Past Medical History:  Diagnosis Date  . Arrhythmia   . Arthritis   . Cancer ([place])   . Erectile dysfunction   . History of inguinal hernia repair    right  . Hx of ulcer disease    20 YRS AGO  . Mental disorder SKIN CANCER  . PVC (premature ventricular contraction)   . Snores     Past Surgical History:  Procedure Laterality Date  . HERNIA REPAIR     RIH as a child  . INGUINAL HERNIA REPAIR  08/15/2011   Procedure: LAPAROSCOPIC INGUINAL HERNIA;  Surgeon: Edward Jolly, MD;  Location: [facility];  Service: General;  Laterality: Right;  LAPAROSCOPIC RIGHT INGUINAL HERNIA Repair  . LEFT HEART CATH AND CORONARY ANGIOGRAPHY N/A 10/24/2017   Procedure: LEFT HEART CATH AND CORONARY ANGIOGRAPHY;  Surgeon: [name], MD;  Location: [place];  Service: Cardiovascular;  Laterality: N/A;  . TONSILLECTOMY  1966    Current Medications: Current Meds  Medication Sig  . Ascorbic Acid (VITAMIN C) 1000 MG tablet Take 1,000 mg by mouth daily. Taking 1 tablet daily  . aspirin EC 81 MG tablet Take 1 tablet (81 mg total) by mouth daily.  Marland Kitchen  carvedilol (COREG) 12.5 MG tablet Take 1 tablet (12.5 mg total) by mouth 2 (two) times daily.  . Coenzyme Q10 (CO Q-10) 100 MG CAPS Take 1 capsule by mouth daily.  Marland Kitchen lisinopril (ZESTRIL) 40 MG tablet Take 1 tablet (40 mg total) by mouth daily.  . Multiple Vitamins-Minerals (ZINC PO) Take by mouth. Taking 1 tablet daily  . omeprazole (PRILOSEC) 20 MG capsule Take 20 mg by mouth daily.   . rosuvastatin (CRESTOR) 10 MG tablet TAKE ONE TABLET BY MOUTH DAILY  . [DISCONTINUED] amLODipine (NORVASC) 10 MG tablet Take 1 tablet (10 mg total) by mouth daily.  .  [DISCONTINUED] carvedilol (COREG) 6.25 MG tablet TAKE ONE TABLET BY MOUTH TWICE A DAY    Allergies:   Patient has no known allergies.   Social History   Socioeconomic History  . Marital status: Married    Spouse name: Not on file  . Number of children: Not on file  . Years of education: Not on file  . Highest education level: Not on file  Occupational History  . Not on file  Tobacco Use  . Smoking status: Former Smoker    Packs/day: 1.00    Years: 30.00    Pack years: 30.00    Types: Cigarettes    Quit date: 1999    Years since quitting: 23.4  . Smokeless tobacco: Never Used  Vaping Use  . Vaping Use: Never used  Substance and Sexual Activity  . Alcohol use: Yes    Alcohol/week: 8.0 standard drinks    Types: 1 Standard drinks or equivalent, 7 Glasses of wine per week  . Drug use: No  . Sexual activity: Not on file  Other Topics Concern  . Not on file  Social History Narrative  . Not on file   Social Determinants of Health   Financial Resource Strain: Not on file  Food Insecurity: Not on file  Transportation Needs: Not on file  Physical Activity: Not on file  Stress: Not on file  Social Connections: Not on file     Family History:  The patient's family history includes COPD in his mother; Cancer in his father; Heart disease in his mother.  ROS:   Review of Systems  Constitutional: Negative for chills, diaphoresis, fever, malaise/fatigue and weight loss.  HENT: Negative for congestion.   Eyes: Negative for discharge and redness.  Respiratory: Negative for cough, sputum production, shortness of breath and wheezing.   Cardiovascular: Positive for leg swelling. Negative for chest pain, palpitations, orthopnea, claudication and PND.  Gastrointestinal: Negative for abdominal pain, heartburn, nausea and vomiting.  Musculoskeletal: Negative for falls and myalgias.  Skin: Negative for rash.  Neurological: Negative for dizziness, tingling, tremors, sensory change,  speech change, focal weakness, loss of consciousness and weakness.  Endo/Heme/Allergies: Does not bruise/bleed easily.  Psychiatric/Behavioral: Negative for substance abuse. The patient is not nervous/anxious.   All other systems reviewed and are negative.    EKGs/Labs/Other Studies Reviewed:    Studies reviewed were summarized above. The additional studies were reviewed today:  2D echo 01/2018: - Left ventricle: The cavity size was normal. Wall thickness was  increased in a pattern of mild LVH. Systolic function was mildly  to moderately reduced. The estimated ejection fraction was in the  range of 40% to 45%. Diffuse hypokinesis. Doppler parameters are  consistent with abnormal left ventricular relaxation (grade 1  diastolic dysfunction).  - Aorta: Aortic root dimension: 38 mm (ED).  - Ascending aorta: The ascending aorta was mildly dilated.  -  Mitral valve: Calcified annulus.  - Left atrium: The atrium was mildly dilated.   Impressions:   - EF is improved when compared to prior.  __________  LHC 09/2017: Conclusions: 1. Moderate, nonobstructive coronary artery disease involving the proximal LAD and proximal segment of large OM1 branch.  Findings are consistent with nonischemic cardiomyopathy. 2. Mildly elevated left ventricular filling pressure (LVEDP 17 mmHg).  Recommendations: 1. Initiate carvedilol 3.125 mg twice daily for nonischemic cardiomyopathy with moderate to severely reduced LVEF.  Anticipate adding ACEI/ARB/ARNI when patient returns for follow-up in 2 weeks. 2. Continue low-dose aspirin.  Consider adding statin at follow-up appointment to prevent progression of moderate CAD.  We will also check fasting lipid panel and ALT at that time. __________  2D echo 09/2017: - Left ventricle: The cavity size was moderately dilated. Wall  thickness was normal. Systolic function was moderately to  severely reduced. The estimated ejection fraction was in the   range of 30% to 35%. Diffuse hypokinesis. Doppler parameters are  consistent with both elevated ventricular end-diastolic filling  pressure and elevated left atrial filling pressure.  - Mitral valve: Calcified annulus. There was mild regurgitation.  - Left atrium: The atrium was mildly dilated.  - Atrial septum: No defect or patent foramen ovale was identified.   EKG:  EKG is ordered today.  The EKG ordered today demonstrates NSR, 7, left nonspecific, no acute changes, no significant prior tracing  Recent Labs: 06/27/2020: BUN 13; Creatinine, Ser 0.84; Potassium 4.3; Sodium 136  Recent Lipid Panel    Component Value Date/Time   CHOL 147 01/06/2019 0742   TRIG 100 01/06/2019 0742   HDL 61 01/06/2019 0742   CHOLHDL 2.4 01/06/2019 0742   LDLCALC 66 01/06/2019 0742    PHYSICAL EXAM:    VS:  BP 122/68 (BP Location: Left Arm, Patient Position: Sitting, Cuff Size: Normal)   Pulse 70   Ht 6' (1.829 m)   Wt 215 lb 4 oz (97.6 kg)   SpO2 98%   BMI 29.19 kg/m   BMI: Body mass index is 29.19 kg/m.  Physical Exam Vitals reviewed.  Constitutional:      Appearance: He is well-developed.  HENT:     Head: Normocephalic and atraumatic.  Eyes:     General:        Right eye: No discharge.        Left eye: No discharge.  Neck:     Vascular: No JVD.  Cardiovascular:     Rate and Rhythm: Normal rate and regular rhythm.     Pulses: No midsystolic click and no opening snap.          Posterior tibial pulses are 2+ on the right side and 2+ on the left side.     Heart sounds: Normal heart sounds, S1 normal and S2 normal. Heart sounds not distant. No murmur heard. No friction rub.  Pulmonary:     Effort: Pulmonary effort is normal. No respiratory distress.     Breath sounds: Normal breath sounds. No decreased breath sounds, wheezing or rales.  Chest:     Chest wall: No tenderness.  Abdominal:     General: There is no distension.     Palpations: Abdomen is soft.     Tenderness:  There is no abdominal tenderness.  Musculoskeletal:     Cervical back: Normal range of motion.     Right lower leg: Edema present.     Left lower leg: Edema present.     Comments: Trace  to 1+ bilateral ankle edema  Skin:    General: Skin is warm and dry.     Nails: There is no clubbing.  Neurological:     Mental Status: He is alert and oriented to person, place, and time.  Psychiatric:        Speech: Speech normal.        Behavior: Behavior normal.        Thought Content: Thought content normal.        Judgment: Judgment normal.     Wt Readings from Last 3 Encounters:  12/07/20 215 lb 4 oz (97.6 kg)  07/12/20 219 lb 8 oz (99.6 kg)  04/20/20 219 lb (99.3 kg)     ASSESSMENT & PLAN:   1. Nonobstructive CAD: No symptoms concerning for angina.  Continue risk factor modification and current medical therapy including aspirin, carvedilol, lisinopril, and rosuvastatin.  No indication for ischemic testing at this time.  2. HFrEF secondary to NICM: He appears euvolemic and well compensated with NYHA class I symptoms.  His weight is down 4 pounds today when compared to his last clinic visit.  He does note a several month history of mild bilateral ankle edema.  This is possibly in the setting of amlodipine.  Given his cardiomyopathy, we will plan to update an echo to evaluate his LVSF with recommendations to optimize GDMT as indicated based on results including possible transition from ACEi to [facility] following 36-hour washout with continued escalation of carvedilol and tapering of amlodipine, as well as possible addition of MRA and SGLT2i.  With regards to his lower extremity swelling at this time, we will decrease his amlodipine to 5 mg daily with plans to likely fully discontinue this medication when he is seen in follow-up.  To compensate, we will titrate carvedilol to 12 point milligrams twice daily.  He will otherwise continue lisinopril 40 mg daily.  3. Frequent PVCs: Quiescent.  Carvedilol  as above.  4. Dilated aortic root: Trend on echo.  Unable to obtain a baseline CTA of the aorta at this time secondary to national contrast shortage.  This can be revisited down the road.  Continue optimal blood pressure and lipid control.  5. HTN: Blood pressure is well controlled in the office.  Medical therapy as outlined above.  6. HLD: LDL 89.  He remains on rosuvastatin.   Disposition: F/u with Dr. Saunders Revel or an APP in 4 weeks, following echo.   Medication Adjustments/Labs and Tests Ordered: Current medicines are reviewed at length with the patient today.  Concerns regarding medicines are outlined above. Medication changes, Labs and Tests ordered today are summarized above and listed in the Patient Instructions accessible in Encounters.   Signed, Christell Faith, PA-C 12/07/2020 4:17 PM     [address] [place], [place] [address] [phone]

## 2020-12-07 ENCOUNTER — Encounter: Payer: Self-pay | Admitting: Physician Assistant

## 2020-12-07 ENCOUNTER — Other Ambulatory Visit: Payer: Self-pay

## 2020-12-07 ENCOUNTER — Ambulatory Visit: Payer: Medicare Other | Admitting: Physician Assistant

## 2020-12-07 VITALS — BP 122/68 | HR 70 | Ht 72.0 in | Wt 215.2 lb

## 2020-12-07 DIAGNOSIS — I5022 Chronic systolic (congestive) heart failure: Secondary | ICD-10-CM | POA: Diagnosis not present

## 2020-12-07 DIAGNOSIS — I493 Ventricular premature depolarization: Secondary | ICD-10-CM

## 2020-12-07 DIAGNOSIS — E785 Hyperlipidemia, unspecified: Secondary | ICD-10-CM

## 2020-12-07 DIAGNOSIS — I10 Essential (primary) hypertension: Secondary | ICD-10-CM | POA: Diagnosis not present

## 2020-12-07 DIAGNOSIS — I2510 Atherosclerotic heart disease of native coronary artery without angina pectoris: Secondary | ICD-10-CM | POA: Diagnosis not present

## 2020-12-07 DIAGNOSIS — I428 Other cardiomyopathies: Secondary | ICD-10-CM

## 2020-12-07 DIAGNOSIS — I77810 Thoracic aortic ectasia: Secondary | ICD-10-CM

## 2020-12-07 MED ORDER — CARVEDILOL 12.5 MG PO TABS: 12.5000 mg | ORAL_TABLET | Freq: Two times a day (BID) | ORAL | 3 refills | Status: DC

## 2020-12-07 MED ORDER — AMLODIPINE BESYLATE 10 MG PO TABS: 5.0000 mg | ORAL_TABLET | Freq: Every day | ORAL | 3 refills | Status: DC

## 2020-12-07 NOTE — Patient Instructions (Signed)
Medication Instructions:  Decrease Amlodipine to 5 mg daily.  Increase Coreg to 12.5 mg one tablet twice daily.   *If you need a refill on your cardiac medications before your next appointment, please call your pharmacy*   Lab Work: BMET Today   If you have labs (blood work) drawn today and your tests are completely normal, you will receive your results only by: Marland Kitchen MyChart Message (if you have MyChart) OR . A paper copy in the mail If you have any lab test that is abnormal or we need to change your treatment, we will call you to review the results.   Testing/Procedures:Your physician has requested that you have an echocardiogram. Echocardiography is a painless test that uses sound waves to create images of your heart. It provides your doctor with information about the size and shape of your heart and how well your heart's chambers and valves are working. This procedure takes approximately one hour. There are no restrictions for this procedure.    Follow-Up: At [facility], you and your health needs are our priority.  As part of our continuing mission to provide you with exceptional heart care, we have created designated Provider Care Teams.  These Care Teams include your primary Cardiologist (physician) and Advanced Practice Providers (APPs -  Physician Assistants and Nurse Practitioners) who all work together to provide you with the care you need, when you need it.  We recommend signing up for the patient portal called "MyChart".  Sign up information is provided on this After Visit Summary.  MyChart is used to connect with patients for Virtual Visits (Telemedicine).  Patients are able to view lab/test results, encounter notes, upcoming appointments, etc.  Non-urgent messages can be sent to your provider as well.   To learn more about what you can do with MyChart, go to [email or web address].    Your next appointment:   4 week(s)  The format for your next appointment:   In  Person  Provider:   You may see [name], MD or one of the following Advanced Practice Providers on your designated Care Team:    Christell Faith, [place]

## 2020-12-08 LAB — BASIC METABOLIC PANEL
BUN/Creatinine Ratio: 15 (ref 10–24)
BUN: 14 mg/dL (ref 8–27)
CO2: 20 mmol/L (ref 20–29)
Calcium: 9.4 mg/dL (ref 8.6–10.2)
Chloride: 100 mmol/L (ref 96–106)
Creatinine, Ser: 0.92 mg/dL (ref 0.76–1.27)
Glucose: 109 mg/dL — ABNORMAL HIGH (ref 65–99)
Potassium: 4.4 mmol/L (ref 3.5–5.2)
Sodium: 139 mmol/L (ref 134–144)
eGFR: 88 mL/min/{1.73_m2} (ref >59)

## 2020-12-29 ENCOUNTER — Other Ambulatory Visit: Payer: Self-pay

## 2020-12-29 ENCOUNTER — Ambulatory Visit (INDEPENDENT_AMBULATORY_CARE_PROVIDER_SITE_OTHER): Payer: Medicare Other

## 2020-12-29 DIAGNOSIS — I428 Other cardiomyopathies: Secondary | ICD-10-CM

## 2020-12-30 LAB — ECHOCARDIOGRAM COMPLETE
AR max vel: 3.22 cm2
AV Area VTI: 3.68 cm2
AV Area mean vel: 3.33 cm2
AV Mean grad: 6 mmHg
AV Peak grad: 9.9 mmHg
Ao pk vel: 1.57 m/s
Area-P 1/2: 3.17 cm2
Calc EF: 46.8 %
S' Lateral: 3.9 cm
Single Plane A2C EF: 47.8 %
Single Plane A4C EF: 42.2 %

## 2021-01-02 ENCOUNTER — Telehealth: Payer: Medicare Other | Admitting: Family Medicine

## 2021-01-02 DIAGNOSIS — U071 COVID-19: Secondary | ICD-10-CM

## 2021-01-02 MED ORDER — MOLNUPIRAVIR EUA 200MG CAPSULE: 4.0000 | ORAL_CAPSULE | Freq: Two times a day (BID) | ORAL | 0 refills | Status: AC

## 2021-01-02 NOTE — Progress Notes (Signed)
Virtual Visit Consent   Frederick Goodwin, you are scheduled for a virtual visit with a [facility] provider today.     Just as with appointments in the office, your consent must be obtained to participate.  Your consent will be active for this visit and any virtual visit you may have with one of our providers in the next 365 days.     If you have a MyChart account, a copy of this consent can be sent to you electronically.  All virtual visits are billed to your insurance company just like a traditional visit in the office.    As this is a virtual visit, video technology does not allow for your provider to perform a traditional examination.  This may limit your provider's ability to fully assess your condition.  If your provider identifies any concerns that need to be evaluated in person or the need to arrange testing (such as labs, EKG, etc.), we will make arrangements to do so.     Although advances in technology are sophisticated, we cannot ensure that it will always work on either your end or our end.  If the connection with a video visit is poor, the visit may have to be switched to a telephone visit.  With either a video or telephone visit, we are not always able to ensure that we have a secure connection.     I need to obtain your verbal consent now.   Are you willing to proceed with your visit today?    Frederick Goodwin has provided verbal consent on 01/02/2021 for a virtual visit (video or telephone).   Perlie Mayo, NP   Date: 01/02/2021 1:19 PM   Virtual Visit via Video Note   I, Perlie Mayo, connected with  Frederick Goodwin  ([id number], 10/19/1945) on 01/02/21 at  1:00 PM EDT by a video-enabled telemedicine application and verified that I am speaking with the correct person using two identifiers.  Location: Patient: Virtual Visit Location Patient: Home Provider: Virtual Visit Location Provider: Home Office   I discussed the limitations of evaluation and management by  telemedicine and the availability of in person appointments. The patient expressed understanding and agreed to proceed.    History of Present Illness: Frederick Goodwin is a 75 y.o. who identifies as a male who was assigned male at birth, and is being seen today for + covid test. Energy level change Saturday. Sunday woke up and felt a little sick, and did a home test was positive x 2. Wife is negative. He is self isolating at home. His symptoms include but are not limited to sinus drainage, shortness of breath- which is very mild per him. Denies fever, chills, chest pain, sore throat, and ear pain.  Vaccinated and boosted 2 x 2 Exposure on Thursday while viewing a house.   HPI: HPI  Problems:  Patient Active Problem List   Diagnosis Date Noted   Chronic HFrEF (heart failure with reduced ejection fraction) ([place]) 04/21/2020   Hyperlipidemia LDL goal <70 10/23/2018   Essential hypertension 10/23/2018   Coronary artery disease involving native coronary artery of native heart without angina pectoris 11/05/2017   NICM (nonischemic cardiomyopathy) ([place]) 10/24/2017   IVCD (intraventricular conduction defect) 10/08/2017   PVCs (premature ventricular contractions)    Arrhythmia    Inguinal hernia, recurrent 07/13/2011    Allergies: No Known Allergies Medications:  Current Outpatient Medications:    molnupiravir EUA 200 mg CAPS, Take 4 capsules (800 mg  total) by mouth 2 (two) times daily for 5 days., Disp: 40 capsule, Rfl: 0   amLODipine (NORVASC) 10 MG tablet, Take 0.5 tablets (5 mg total) by mouth daily., Disp: 90 tablet, Rfl: 3   Ascorbic Acid (VITAMIN C) 1000 MG tablet, Take 1,000 mg by mouth daily. Taking 1 tablet daily, Disp: , Rfl:    aspirin EC 81 MG tablet, Take 1 tablet (81 mg total) by mouth daily., Disp: 90 tablet, Rfl: 3   carvedilol (COREG) 12.5 MG tablet, Take 1 tablet (12.5 mg total) by mouth 2 (two) times daily., Disp: 180 tablet, Rfl: 3   Coenzyme Q10 (CO Q-10) 100 MG CAPS, Take 1  capsule by mouth daily., Disp: , Rfl:    lisinopril (ZESTRIL) 40 MG tablet, Take 1 tablet (40 mg total) by mouth daily., Disp: 90 tablet, Rfl: 2   Multiple Vitamins-Minerals (ZINC PO), Take by mouth. Taking 1 tablet daily, Disp: , Rfl:    omeprazole (PRILOSEC) 20 MG capsule, Take 20 mg by mouth daily. , Disp: , Rfl:    rosuvastatin (CRESTOR) 10 MG tablet, TAKE ONE TABLET BY MOUTH DAILY, Disp: 90 tablet, Rfl: 0  Observations/Objective: Patient is well-developed, well-nourished in no acute distress.  Resting comfortably  at home.  Head is normocephalic, atraumatic.  No labored breathing.  Speech is clear and coherent with logical content.  Patient is alert and oriented at baseline.    Assessment and Plan: 1. COVID-19 - molnupiravir EUA 200 mg CAPS; Take 4 capsules (800 mg total) by mouth 2 (two) times daily for 5 days.  Dispense: 40 capsule; Refill: 0 -advised to continue to take vitamin C and zinc -educated on COVID precautions and completion of antiviral medication -OTC measures encouraged and reviewed. -Patient acknowledged agreement and understanding of the plan.     Follow Up Instructions: I discussed the assessment and treatment plan with the patient. The patient was provided an opportunity to ask questions and all were answered. The patient agreed with the plan and demonstrated an understanding of the instructions.  A copy of instructions were sent to the patient via MyChart.  The patient was advised to call back or seek an in-person evaluation if the symptoms worsen or if the condition fails to improve as anticipated.  Time:  I spent 10 minutes with the patient via telehealth technology discussing the above problems/concerns.    Perlie Mayo, NP

## 2021-01-02 NOTE — Patient Instructions (Signed)
I hope you feel better. Please take all the antiviral Continue to isolate from wife.  Consider the OTC measures we discussed.  Happy 4th  Molnupiravir Oral Capsules What is this medication? MOLNUPIRAVIR (mol nue pir a vir) treats COVID-19. It is an antiviral medication. It may decrease the risk of developing severe symptoms of COVID-19. It may also decrease the chance of going to the hospital. This medication is not approved by the FDA. The FDA has authorized emergency use of thismedication during the COVID-19 pandemic. This medicine may be used for other purposes; ask your health care provider orpharmacist if you have questions. What should I tell my care team before I take this medication? They need to know if you have any of these conditions: Any allergies Any serious illness An unusual or allergic reaction to molnupiravir, other medications, foods, dyes, or preservatives Pregnant or trying to get pregnant Breast-feeding How should I use this medication? Take this medication by mouth with water. Take it as directed on the prescription label at the same time every day. Do not cut, crush or chew this medication. Swallow the capsules whole. You can take it with or without food. If it upsets your stomach, take it with food. Take all of this medication unless your care team tells you to stop it early. Keep taking it even if youthink you are better. Talk to your care team about the use of this medication in children. Specialcare may be needed. Overdosage: If you think you have taken too much of this medicine contact apoison control center or emergency room at once. NOTE: This medicine is only for you. Do not share this medicine with others. What if I miss a dose? If you miss a dose, take it as soon as you can unless it is more than 10 hours late. If it is more than 10 hours late, skip the missed dose. Take the next dose at the normal time. Do not take extra or 2 doses at the same time to makeup  for the missed dose. What may interact with this medication? Interactions have not been studied. This list may not describe all possible interactions. Give your health care provider a list of all the medicines, herbs, non-prescription drugs, or dietary supplements you use. Also tell them if you smoke, drink alcohol, or use illegaldrugs. Some items may interact with your medicine. What should I watch for while using this medication? Your condition will be monitored carefully while you are receiving this medication. Visit your care team for regular checkups. Tell your care team ifyour symptoms do not start to get better or if they get worse. Do not become pregnant while taking this medication. You may need a pregnancy test before starting this medication. Women must use a reliable form of birth control while taking this medication and for 4 days after stopping the medication. Women should inform their care team if they wish to become pregnant or think they might be pregnant. Men should not father a child while taking this medication and for 3 months after stopping it. There is potential for serious harm to an unborn child. Talk to your care team for more information. Do not breast-feed an infant while taking this medication and for 4 days afterstopping the medication. What side effects may I notice from receiving this medication? Side effects that you should report to your care team as soon as possible: Allergic reactions-skin rash, itching, hives, swelling of the face, lips, tongue, or throat Side effects that usually  do not require medical attention (report these toyour care team if they continue or are bothersome): Diarrhea Dizziness Nausea This list may not describe all possible side effects. Call your doctor for medical advice about side effects. You may report side effects to FDA at1-800-FDA-1088. Where should I keep my medication? Keep out of the reach of children and pets. Store at room  temperature between 20 and 25 degrees C (68 and 77 degrees F).Get rid of any unused medication after the expiration date. To get rid of medications that are no longer needed or have expired: Take the medication to a medication take-back program. Check with your pharmacy or law enforcement to find a location. If you cannot return the medication, check the label or package insert to see if the medication should be thrown out in the garbage or flushed down the toilet. If you are not sure, ask your care team. If it is safe to put it in the trash, take the medication out of the container. Mix the medication with cat litter, dirt, coffee grounds, or other unwanted substance. Seal the mixture in a bag or container. Put it in the trash. NOTE: This sheet is a summary. It may not cover all possible information. If you have questions about this medicine, talk to your doctor, pharmacist, orhealth care provider.  2022 Elsevier/Gold Standard (2020-06-27 16:16:01)

## 2021-01-07 NOTE — Progress Notes (Deleted)
Cardiology Office Note    Date:  01/07/2021   ID:  Frederick Goodwin, DOB 1946-01-06, MRN [id number]  [name], MD  [name], MD  Electrophysiologist:  None   Chief Complaint: Follow-up  History of Present Illness:   Frederick Goodwin is a 75 y.o. male with history of nonobstructive CAD, HFrEF secondary to NICM, frequent PVCs, HTN, HLD, PUD, ED, and skin cancer who presents for follow-up of recent echo.   Echo in 09/2017 showed an EF of 30 to 35% with diffuse hypokinesis, mild mitral regurgitation, and mild left atrial enlargement.  Diagnostic LHC in 09/2017 showed nonobstructive CAD with proximal LAD 40% stenosis, OM1 50% stenosis, and minimal luminal irregularities involving the RCA.  Findings were consistent with nonischemic cardiomyopathy.  Following escalation of GDMT, repeat echo in 01/2018 showed an improvement in his LVEF at 40 to 25%, grade 1 diastolic dysfunction, and a mildly dilated ascending aorta.  HCTZ has previously been discontinued in the setting of mild hyponatremia.  He was seen in the office in 07/2020 noting persistently elevated BP despite escalation of antihypertensive therapy.  Upon comparing 1 of his home BP cuff's to the office's, it was found his home BP cuff was inaccurate with readings up to 30 mmHg higher than the manual reading.  His Omron BP cuff was relatively consistent with office readings.  No medication changes were made.  He was last seen in the office in 11/2020 and was doing well from a cardiac perspective.  He did note a several month history of intermittent bilateral ankle swelling.  His weight was down 4 pounds when compared to his last visit.  His amlodipine was decreased to 5 mg.  To compensate, and to further optimize GDMT, carvedilol was titrated to 12.5 mg.  Echo on 6/30 showed a stable cardiomyopathy with an EF of 40 to 45%, global hypokinesis, grade 2 diastolic dysfunction, low normal RV systolic function with normal  ventricular cavity size, mildly dilated left atrium, trivial mitral regurgitation, mild aortic valve sclerosis without evidence of stenosis, and a mildly dilated aortic root measuring 39 mm.  When compared to prior echo from 2019, his cardiomyopathy and a dilated aortic root were stable.  ***     Labs independently reviewed: 11/2020 - BUN 14, serum creatinine 0.92, potassium 4.4 06/2020 - albumin 4.3, AST/ALT normal Hgb 14, PLT 195, HDL 59, LDL 89, TSH normal   Past Medical History:  Diagnosis Date   Arrhythmia    Arthritis    Cancer ([place])    Erectile dysfunction    History of inguinal hernia repair    right   Hx of ulcer disease    20 YRS AGO   Mental disorder SKIN CANCER   PVC (premature ventricular contraction)    Snores     Past Surgical History:  Procedure Laterality Date   HERNIA REPAIR     RIH as a child   INGUINAL HERNIA REPAIR  08/15/2011   Procedure: LAPAROSCOPIC INGUINAL HERNIA;  Surgeon: Edward Jolly, MD;  Location: [facility];  Service: General;  Laterality: Right;  LAPAROSCOPIC RIGHT INGUINAL HERNIA Repair   LEFT HEART CATH AND CORONARY ANGIOGRAPHY N/A 10/24/2017   Procedure: LEFT HEART CATH AND CORONARY ANGIOGRAPHY;  Surgeon: [name], MD;  Location: [facility];  Service: Cardiovascular;  Laterality: N/A;   TONSILLECTOMY  1966    Current Medications: No outpatient medications have been marked as taking for the 01/12/21 encounter (Appointment) with Frederick Goodwin,  Areta Haber, PA-C.    Allergies:   Patient has no known allergies.   Social History   Socioeconomic History   Marital status: Married    Spouse name: Not on file   Number of children: Not on file   Years of education: Not on file   Highest education level: Not on file  Occupational History   Not on file  Tobacco Use   Smoking status: Former    Packs/day: 1.00    Years: 30.00    Pack years: 30.00    Types: Cigarettes    Quit date: 1999    Years since quitting: 23.5   Smokeless  tobacco: Never  Vaping Use   Vaping Use: Never used  Substance and Sexual Activity   Alcohol use: Yes    Alcohol/week: 8.0 standard drinks    Types: 1 Standard drinks or equivalent, 7 Glasses of wine per week   Drug use: No   Sexual activity: Not on file  Other Topics Concern   Not on file  Social History Narrative   Not on file   Social Determinants of Health   Financial Resource Strain: Not on file  Food Insecurity: Not on file  Transportation Needs: Not on file  Physical Activity: Not on file  Stress: Not on file  Social Connections: Not on file     Family History:  The patient's family history includes COPD in his mother; Cancer in his father; Heart disease in his mother.  ROS:   ROS   EKGs/Labs/Other Studies Reviewed:    Studies reviewed were summarized above. The additional studies were reviewed today:  2D echo 12/29/2020:  1. Left ventricular ejection fraction, by estimation, is 40 to 45%. Left  ventricular ejection fraction by 3D volume is 45 %. The left ventricle has  mild to moderately decreased function. The left ventricle demonstrates  global hypokinesis. Left  ventricular diastolic parameters are consistent with Grade II diastolic  dysfunction (pseudonormalization). The average left ventricular global  longitudinal strain is -16.6 %.   2. Right ventricular systolic function is low normal. The right  ventricular size is normal.   3. Left atrial size was mildly dilated.   4. The mitral valve is normal in structure. Trivial mitral valve  regurgitation.   5. The aortic valve is tricuspid. Aortic valve regurgitation is not  visualized. Mild aortic valve sclerosis is present, with no evidence of  aortic valve stenosis.   6. Aortic dilatation noted. There is mild dilatation of the aortic root,  measuring 39 mm.  __________  2D echo 01/2018: - Left ventricle: The cavity size was normal. Wall thickness was    increased in a pattern of mild LVH. Systolic  function was mildly    to moderately reduced. The estimated ejection fraction was in the    range of 40% to 45%. Diffuse hypokinesis. Doppler parameters are    consistent with abnormal left ventricular relaxation (grade 1    diastolic dysfunction).  - Aorta: Aortic root dimension: 38 mm (ED).  - Ascending aorta: The ascending aorta was mildly dilated.  - Mitral valve: Calcified annulus.  - Left atrium: The atrium was mildly dilated.   Impressions:   - EF is improved when compared to prior. __________   LHC 09/2017: Conclusions: Moderate, nonobstructive coronary artery disease involving the proximal LAD and proximal segment of large OM1 branch.  Findings are consistent with nonischemic cardiomyopathy. Mildly elevated left ventricular filling pressure (LVEDP 17 mmHg).   Recommendations: Initiate carvedilol 3.125  mg twice daily for nonischemic cardiomyopathy with moderate to severely reduced LVEF.  Anticipate adding ACEI/ARB/ARNI when patient returns for follow-up in 2 weeks. Continue low-dose aspirin.  Consider adding statin at follow-up appointment to prevent progression of moderate CAD.  We will also check fasting lipid panel and ALT at that time. __________   2D echo 09/2017: - Left ventricle: The cavity size was moderately dilated. Wall    thickness was normal. Systolic function was moderately to    severely reduced. The estimated ejection fraction was in the    range of 30% to 35%. Diffuse hypokinesis. Doppler parameters are    consistent with both elevated ventricular end-diastolic filling    pressure and elevated left atrial filling pressure.  - Mitral valve: Calcified annulus. There was mild regurgitation.  - Left atrium: The atrium was mildly dilated.  - Atrial septum: No defect or patent foramen ovale was identified.   EKG:  EKG is ordered today.  The EKG ordered today demonstrates ***  Recent Labs: 12/07/2020: BUN 14; Creatinine, Ser 0.92; Potassium 4.4; Sodium 139   Recent Lipid Panel    Component Value Date/Time   CHOL 147 01/06/2019 0742   TRIG 100 01/06/2019 0742   HDL 61 01/06/2019 0742   CHOLHDL 2.4 01/06/2019 0742   LDLCALC 66 01/06/2019 0742    PHYSICAL EXAM:    VS:  There were no vitals taken for this visit.  BMI: There is no height or weight on file to calculate BMI.  Physical Exam  Wt Readings from Last 3 Encounters:  12/07/20 215 lb 4 oz (97.6 kg)  07/12/20 219 lb 8 oz (99.6 kg)  04/20/20 219 lb (99.3 kg)     ASSESSMENT & PLAN:   Nonobstructive CAD:  HFrEF secondary to NICM:  Frequent PVCs:  Dilated aortic root:  HTN: Blood pressure ***  HLD: LDL 89.  Disposition: F/u with Dr. Saunders Revel or an APP in ***.   Medication Adjustments/Labs and Tests Ordered: Current medicines are reviewed at length with the patient today.  Concerns regarding medicines are outlined above. Medication changes, Labs and Tests ordered today are summarized above and listed in the Patient Instructions accessible in Encounters.   Signed, Christell Faith, PA-C 01/07/2021 12:02 PM     [address], [place] [id number] [phone]

## 2021-01-10 ENCOUNTER — Telehealth: Payer: Self-pay | Admitting: Physician Assistant

## 2021-01-10 NOTE — Telephone Encounter (Signed)
Was able to reach back out to Frederick Goodwin regarding medications. Advised to continue his meds as discuss on 12/07/2020 at last appt with Frederick Faith, Frederick Goodwin, apologize for appt cancel d/t provider being out.   Continue Amlodipine to 5 mg daily. Coreg to 12.5 mg one tablet twice daily.   Echo results reviewed with pt as well, was originally discuss with Pam, Frederick Goodwin a few days ago, no med changed at that time, did note would discuss further at f/u appt that has now has been cancel.  Advise pt to continue meds, monitor BP/HR and for any cardiac symptoms, will be able to discuss at next f/u appt. But if any concerns to please call the clinic for advice. Frederick Goodwin verbalized understanding, nothing further at this time.

## 2021-01-10 NOTE — Telephone Encounter (Signed)
Patient appt rescheduled due to provider out .    Patient wants to know POC for meds as this visit was a 4 week fu with adjustments pending.

## 2021-01-12 ENCOUNTER — Ambulatory Visit: Payer: Medicare Other | Admitting: Physician Assistant

## 2021-01-16 ENCOUNTER — Other Ambulatory Visit: Payer: Self-pay | Admitting: Internal Medicine

## 2021-01-19 ENCOUNTER — Other Ambulatory Visit: Payer: Self-pay | Admitting: Internal Medicine

## 2021-02-05 DIAGNOSIS — I10 Essential (primary) hypertension: Secondary | ICD-10-CM

## 2021-02-06 MED ORDER — AMLODIPINE BESYLATE 5 MG PO TABS: 5.0000 mg | ORAL_TABLET | Freq: Every day | ORAL | 0 refills | Status: DC

## 2021-02-07 MED ORDER — AMLODIPINE BESYLATE 5 MG PO TABS: 5.0000 mg | ORAL_TABLET | Freq: Every day | ORAL | 0 refills | Status: DC

## 2021-02-07 NOTE — Addendum Note (Signed)
Addended by: Rise Mu on: 02/07/2021 04:10 PM   Modules accepted: Orders

## 2021-02-22 NOTE — Progress Notes (Signed)
Cardiology Office Note    Date:  02/23/2021   ID:  RUBLE Goodwin, DOB Oct 04, 1945, MRN [id number]  [name], MD  [name], MD  Electrophysiologist:  None   Chief Complaint: Follow-up  History of Present Illness:   Frederick Goodwin is a 75 y.o. male with history of nonobstructive CAD, HFrEF secondary to NICM, frequent PVCs, COVID in 12/2020, HTN, HLD, PUD, ED, and skin cancer who presents for follow-up of his cardiomyopathy.   Echo in 09/2017 showed an EF of 30 to 35% with diffuse hypokinesis, mild mitral regurgitation, and mild left atrial enlargement.  Diagnostic LHC in 09/2017 showed nonobstructive CAD with proximal LAD 40% stenosis, OM1 50% stenosis, and minimal luminal irregularities involving the RCA.  Findings were consistent with nonischemic cardiomyopathy.  Following escalation of GDMT, repeat echo in 01/2018 showed an improvement in his LVEF at 40 to [id number], grade 1 diastolic dysfunction, and a mildly dilated ascending aorta.  HCTZ has previously been discontinued in the setting of mild hyponatremia.  He was seen in the office in 07/2020 noting persistently elevated BP despite escalation of antihypertensive therapy.  Upon comparing one of his home BP cuff's to the office's, it was found his home BP cuff was inaccurate with readings up to 30 mmHg higher than the manual reading.  His Omron BP cuff was relatively consistent with office readings.  No medication changes were made.  He was last seen in the office in 11/2020 and was doing well from a cardiac perspective.  Amlodipine was tapered to 5 mg and carvedilol was titrated to 12.5 mg twice daily.  Subsequent echo on 12/29/2020 demonstrated an EF of 40 to 45%, global hypokinesis, grade 2 diastolic dysfunction, low normal RV systolic function and normal RV cavity size, mildly dilated left atrium, trivial mitral regurgitation, mild aortic valve sclerosis without evidence of stenosis, and a mildly dilated aortic root  measuring 39 mm.   He comes in continuing to do well from a cardiac perspective.  No angina, dyspnea, palpitations, dizziness, presyncope, or syncope.  Since decreasing amlodipine, his ankle edema has improved.  Weight remains stable.  He continues to watch his salt and fluid intake.  He remains active and recently moved.  He does not have any issues or concerns at this time.     Labs independently reviewed: 11/2020 - BUN 14, serum creatinine 0.92, potassium 4.4 06/2020 - albumin 4.3, AST/ALT normal Hgb 14, PLT 195, HDL 59, LDL 89, TSH normal   Past Medical History:  Diagnosis Date   Arrhythmia    Arthritis    Cancer ([place])    Erectile dysfunction    History of inguinal hernia repair    right   Hx of ulcer disease    20 YRS AGO   Mental disorder SKIN CANCER   PVC (premature ventricular contraction)    Snores     Past Surgical History:  Procedure Laterality Date   HERNIA REPAIR     RIH as a child   INGUINAL HERNIA REPAIR  08/15/2011   Procedure: LAPAROSCOPIC INGUINAL HERNIA;  Surgeon: Edward Jolly, MD;  Location: [facility];  Service: General;  Laterality: Right;  LAPAROSCOPIC RIGHT INGUINAL HERNIA Repair   LEFT HEART CATH AND CORONARY ANGIOGRAPHY N/A 10/24/2017   Procedure: LEFT HEART CATH AND CORONARY ANGIOGRAPHY;  Surgeon: [name], MD;  Location: [facility];  Service: Cardiovascular;  Laterality: N/A;   TONSILLECTOMY  1966    Current Medications: Current Meds  Medication Sig   Ascorbic Acid (VITAMIN C) 1000 MG tablet Take 1,000 mg by mouth daily. Taking 1 tablet daily   aspirin EC 81 MG tablet Take 1 tablet (81 mg total) by mouth daily.   carvedilol (COREG) 12.5 MG tablet Take 1 tablet (12.5 mg total) by mouth 2 (two) times daily.   Coenzyme Q10 (CO Q-10) 100 MG CAPS Take 1 capsule by mouth daily.   omeprazole (PRILOSEC) 20 MG capsule Take 20 mg by mouth daily.    rosuvastatin (CRESTOR) 10 MG tablet TAKE ONE TABLET BY MOUTH DAILY   sacubitril-valsartan  (ENTRESTO) 24-26 MG Take 1 tablet by mouth 2 (two) times daily.   ZINC GLUCONATE PO Take by mouth daily.   [DISCONTINUED] amLODipine (NORVASC) 5 MG tablet Take 1 tablet (5 mg total) by mouth daily for 17 days.   [DISCONTINUED] lisinopril (ZESTRIL) 40 MG tablet Take 1 tablet (40 mg total) by mouth daily.    Allergies:   Patient has no known allergies.   Social History   Socioeconomic History   Marital status: Married    Spouse name: Not on file   Number of children: Not on file   Years of education: Not on file   Highest education level: Not on file  Occupational History   Not on file  Tobacco Use   Smoking status: Former    Packs/day: 1.00    Years: 30.00    Pack years: 30.00    Types: Cigarettes    Quit date: 1999    Years since quitting: 23.6   Smokeless tobacco: Never  Vaping Use   Vaping Use: Never used  Substance and Sexual Activity   Alcohol use: Yes    Alcohol/week: 8.0 standard drinks    Types: 1 Standard drinks or equivalent, 7 Glasses of wine per week   Drug use: No   Sexual activity: Not on file  Other Topics Concern   Not on file  Social History Narrative   Not on file   Social Determinants of Health   Financial Resource Strain: Not on file  Food Insecurity: Not on file  Transportation Needs: Not on file  Physical Activity: Not on file  Stress: Not on file  Social Connections: Not on file     Family History:  The patient's family history includes COPD in his mother; Cancer in his father; Heart disease in his mother.  ROS:   Review of Systems  Constitutional:  Negative for chills, diaphoresis, fever, malaise/fatigue and weight loss.  HENT:  Negative for congestion.   Eyes:  Negative for discharge and redness.  Respiratory:  Negative for cough, sputum production, shortness of breath and wheezing.   Cardiovascular:  Negative for chest pain, palpitations, orthopnea, claudication, leg swelling and PND.  Gastrointestinal:  Negative for abdominal pain,  heartburn, nausea and vomiting.  Musculoskeletal:  Negative for falls and myalgias.  Skin:  Negative for rash.  Neurological:  Negative for dizziness, tingling, tremors, sensory change, speech change, focal weakness, loss of consciousness and weakness.  Endo/Heme/Allergies:  Does not bruise/bleed easily.  Psychiatric/Behavioral:  Negative for substance abuse. The patient is not nervous/anxious.   All other systems reviewed and are negative.   EKGs/Labs/Other Studies Reviewed:    Studies reviewed were summarized above. The additional studies were reviewed today:  2D echo 12/29/2020: 1. Left ventricular ejection fraction, by estimation, is 40 to 45%. Left  ventricular ejection fraction by 3D volume is 45 %. The left ventricle has  mild to moderately decreased function.  The left ventricle demonstrates  global hypokinesis. Left  ventricular diastolic parameters are consistent with Grade II diastolic  dysfunction (pseudonormalization). The average left ventricular global  longitudinal strain is -16.6 %.   2. Right ventricular systolic function is low normal. The right  ventricular size is normal.   3. Left atrial size was mildly dilated.   4. The mitral valve is normal in structure. Trivial mitral valve  regurgitation.   5. The aortic valve is tricuspid. Aortic valve regurgitation is not  visualized. Mild aortic valve sclerosis is present, with no evidence of  aortic valve stenosis.   6. Aortic dilatation noted. There is mild dilatation of the aortic root,  measuring 39 mm. __________  2D echo 01/2018: - Left ventricle: The cavity size was normal. Wall thickness was    increased in a pattern of mild LVH. Systolic function was mildly    to moderately reduced. The estimated ejection fraction was in the    range of 40% to 45%. Diffuse hypokinesis. Doppler parameters are    consistent with abnormal left ventricular relaxation (grade 1    diastolic dysfunction).  - Aorta: Aortic root  dimension: 38 mm (ED).  - Ascending aorta: The ascending aorta was mildly dilated.  - Mitral valve: Calcified annulus.  - Left atrium: The atrium was mildly dilated.   Impressions:   - EF is improved when compared to prior.  __________   LHC 09/2017: Conclusions: Moderate, nonobstructive coronary artery disease involving the proximal LAD and proximal segment of large OM1 branch.  Findings are consistent with nonischemic cardiomyopathy. Mildly elevated left ventricular filling pressure (LVEDP 17 mmHg).   Recommendations: Initiate carvedilol 3.125 mg twice daily for nonischemic cardiomyopathy with moderate to severely reduced LVEF.  Anticipate adding ACEI/ARB/ARNI when patient returns for follow-up in 2 weeks. Continue low-dose aspirin.  Consider adding statin at follow-up appointment to prevent progression of moderate CAD.  We will also check fasting lipid panel and ALT at that time. __________   2D echo 09/2017: - Left ventricle: The cavity size was moderately dilated. Wall    thickness was normal. Systolic function was moderately to    severely reduced. The estimated ejection fraction was in the    range of 30% to 35%. Diffuse hypokinesis. Doppler parameters are    consistent with both elevated ventricular end-diastolic filling    pressure and elevated left atrial filling pressure.  - Mitral valve: Calcified annulus. There was mild regurgitation.  - Left atrium: The atrium was mildly dilated.  - Atrial septum: No defect or patent foramen ovale was identified.   EKG:  EKG is ordered today.  The EKG ordered today demonstrates NSR, 66 bpm, incomplete, no acute ST-T changes, no significant change when compared to prior tracing  Recent Labs: 12/07/2020: BUN 14; Creatinine, Ser 0.92; Potassium 4.4; Sodium 139  Recent Lipid Panel    Component Value Date/Time   CHOL 147 01/06/2019 0742   TRIG 100 01/06/2019 0742   HDL 61 01/06/2019 0742   CHOLHDL 2.4 01/06/2019 0742   LDLCALC 66  01/06/2019 0742    PHYSICAL EXAM:    VS:  BP 118/72 (BP Location: Left Arm, Patient Position: Sitting, Cuff Size: Normal)   Pulse 66   Ht 6' (1.829 m)   Wt 213 lb (96.6 kg)   SpO2 98%   BMI 28.89 kg/m   BMI: Body mass index is 28.89 kg/m.  Physical Exam Vitals reviewed.  Constitutional:      Appearance: He is well-developed.  HENT:  Head: Normocephalic and atraumatic.  Eyes:     General:        Right eye: No discharge.        Left eye: No discharge.  Neck:     Vascular: No JVD.  Cardiovascular:     Rate and Rhythm: Normal rate and regular rhythm.     Pulses:          Posterior tibial pulses are 2+ on the right side and 2+ on the left side.     Heart sounds: Normal heart sounds, S1 normal and S2 normal. Heart sounds not distant. No midsystolic click and no opening snap. No murmur heard.   No friction rub.  Pulmonary:     Effort: Pulmonary effort is normal. No respiratory distress.     Breath sounds: Normal breath sounds. No decreased breath sounds, wheezing or rales.  Chest:     Chest wall: No tenderness.  Abdominal:     General: There is no distension.     Palpations: Abdomen is soft.     Tenderness: There is no abdominal tenderness.  Musculoskeletal:     Cervical back: Normal range of motion.  Skin:    General: Skin is warm and dry.     Nails: There is no clubbing.  Neurological:     Mental Status: He is alert and oriented to person, place, and time.  Psychiatric:        Speech: Speech normal.        Behavior: Behavior normal.        Thought Content: Thought content normal.        Judgment: Judgment normal.    Wt Readings from Last 3 Encounters:  02/23/21 213 lb (96.6 kg)  12/07/20 215 lb 4 oz (97.6 kg)  07/12/20 219 lb 8 oz (99.6 kg)     ASSESSMENT & PLAN:   Nonobstructive CAD: He is doing well without any symptoms concerning for angina.  Continue risk factor modification and current medical therapy including aspirin, carvedilol, and Crestor.  No  indication for ischemic testing at this time.  HFrEF secondary to NICM: He appears euvolemic and well compensated with NYHA class I symptoms.  His weight remains stable.  Given persistent cardiomyopathy on recent echo, we will further optimize GDMT with the discontinuation of lisinopril (last dose this morning).  He will initiate Entresto 24/26 mg twice daily on 8/28, to account for a minimum of 36-hour ACE inhibitor washout.  We will discontinue amlodipine.  He will remain on carvedilol 12.5 mg twice daily for now.  He is not requiring a standing diuretic.  When he is seen in follow-up consider further escalation of evidence-based medical therapy with addition of MRA or SGLT2i along with potential titration of current medical therapy.  Look to repeat a limited echo in approximately 5 months.  CHF education.  Frequent PVCs: Quiescent.  He remains on carvedilol as outlined above.  Dilated aortic root: Stable on recent echo.  HTN: Blood pressure is well controlled in the office today.  Changes to medical therapy as outlined above to further optimize GDMT given his cardiomyopathy.  HLD: LDL 89.  Goal LDL less than 70.  For now, he remains on rosuvastatin 10 mg daily.  Following optimization of GDMT as outlined above, we will likely need to escalate Crestor to achieve target LDL less than 70.  Disposition: F/u with Dr. Saunders Revel or an APP in 1 month.   Medication Adjustments/Labs and Tests Ordered: Current medicines are reviewed at length  with the patient today.  Concerns regarding medicines are outlined above. Medication changes, Labs and Tests ordered today are summarized above and listed in the Patient Instructions accessible in Encounters.   Signed, Christell Faith, PA-C 02/23/2021 12:22 PM     [place], [place] [id number] [phone]

## 2021-02-23 ENCOUNTER — Other Ambulatory Visit: Payer: Self-pay

## 2021-02-23 ENCOUNTER — Telehealth: Payer: Self-pay | Admitting: Internal Medicine

## 2021-02-23 ENCOUNTER — Ambulatory Visit: Payer: Medicare Other | Admitting: Physician Assistant

## 2021-02-23 ENCOUNTER — Encounter: Payer: Self-pay | Admitting: Physician Assistant

## 2021-02-23 VITALS — BP 118/72 | HR 66 | Ht 72.0 in | Wt 213.0 lb

## 2021-02-23 DIAGNOSIS — I77810 Thoracic aortic ectasia: Secondary | ICD-10-CM

## 2021-02-23 DIAGNOSIS — I493 Ventricular premature depolarization: Secondary | ICD-10-CM | POA: Diagnosis not present

## 2021-02-23 DIAGNOSIS — I10 Essential (primary) hypertension: Secondary | ICD-10-CM

## 2021-02-23 DIAGNOSIS — I2510 Atherosclerotic heart disease of native coronary artery without angina pectoris: Secondary | ICD-10-CM

## 2021-02-23 DIAGNOSIS — I428 Other cardiomyopathies: Secondary | ICD-10-CM | POA: Diagnosis not present

## 2021-02-23 DIAGNOSIS — E785 Hyperlipidemia, unspecified: Secondary | ICD-10-CM

## 2021-02-23 DIAGNOSIS — I5022 Chronic systolic (congestive) heart failure: Secondary | ICD-10-CM

## 2021-02-23 DIAGNOSIS — Z79899 Other long term (current) drug therapy: Secondary | ICD-10-CM

## 2021-02-23 MED ORDER — ENTRESTO 24-26 MG PO TABS: 1.0000 | ORAL_TABLET | Freq: Two times a day (BID) | ORAL | 3 refills | Status: DC

## 2021-02-23 NOTE — Patient Instructions (Signed)
Medication Instructions:   Please STOP Amlodipine Lisinopril  Please START Entresto 24-26 mg TWICE a day starting 02/26/2021  *If you need a refill on your cardiac medications before your next appointment, please call your pharmacy*   Lab Work: [facility] (1 week after starting Delene Loll)   Walk into medical mall at the check in desk, they will direct you to lab registration, hours for labs are Monday-Friday 07:00am-5:30pm (no appointment necessary)  LABS WILL APPEAR ON MYCHART, ABNORMAL RESULTS WILL BE CALLED  Testing/Procedures: None   Follow-Up: At [facility], you and your health needs are our priority.  As part of our continuing mission to provide you with exceptional heart care, we have created designated Provider Care Teams.  These Care Teams include your primary Cardiologist (physician) and Advanced Practice Providers (APPs -  Physician Assistants and Nurse Practitioners) who all work together to provide you with the care you need, when you need it.  Your next appointment:   1 month(s)  The format for your next appointment:   In Person  Provider:   Christell Faith, PA-C

## 2021-02-23 NOTE — Telephone Encounter (Signed)
Pt c/o medication issue:  1. Name of Medication: Entresto 24-26 mg po BID   2. How are you currently taking this medication (dosage and times per day)?  Not started yet  3. Are you having a reaction (difficulty breathing--STAT)? no  4. What is your medication issue? Not affordable was told to call after checking cost

## 2021-03-01 NOTE — Telephone Encounter (Signed)
Reviewed patient application, completed, and provider has signed his portion. Form that requires patient signature placed up front and other portion placed in my file box under "Pending Applications". Patient was appreciative for the assistance with no further questions at this time.   He will be by to sign when I am scheduled to be out of the office. So if you can have patient sign and then place in my basket up front I will take care of the rest when I return. Will send message to office staff for them to know about this when he comes in.

## 2021-03-01 NOTE — Telephone Encounter (Signed)
Spoke with patient and he mentioned that he did purchase medication and for a 3 month supply it was $111.00. He reports that he did start medication and it was just expensive for him. Advised that I would be happy to complete patient assistance application for him to see if he would qualify for assistance. He was agreeable and advised I would call once I check to see if any additional information needed. He verbalized understanding with no further questions at this time.

## 2021-03-07 ENCOUNTER — Other Ambulatory Visit
Admission: RE | Admit: 2021-03-07 | Discharge: 2021-03-07 | Disposition: A | Discharge status: Home / Self Care | Payer: Medicare Other | Attending: Physician Assistant | Admitting: Physician Assistant

## 2021-03-07 DIAGNOSIS — Z79899 Other long term (current) drug therapy: Secondary | ICD-10-CM | POA: Insufficient documentation

## 2021-03-07 LAB — BASIC METABOLIC PANEL
Anion gap: 11 (ref 5–15)
BUN: 19 mg/dL (ref 8–23)
CO2: 23 mmol/L (ref 22–32)
Calcium: 9.2 mg/dL (ref 8.9–10.3)
Chloride: 102 mmol/L (ref 98–111)
Creatinine, Ser: 0.9 mg/dL (ref 0.61–1.24)
GFR, Estimated: >60 mL/min (ref >60)
Glucose, Bld: 147 mg/dL — ABNORMAL HIGH (ref 70–99)
Potassium: 3.8 mmol/L (ref 3.5–5.1)
Sodium: 136 mmol/L (ref 135–145)

## 2021-03-07 NOTE — Telephone Encounter (Signed)
Patient signed form placed in box

## 2021-03-15 NOTE — Telephone Encounter (Signed)
[employer] form pulled from [employer]. The patient has signed the patient portion of the application. Placed on top of Pam's cart.

## 2021-03-28 NOTE — Progress Notes (Signed)
Cardiology Office Note    Date:  03/31/2021   ID:  Frederick Goodwin, DOB 02/02/1946, MRN [id number]  [name], MD  [name], MD  Electrophysiologist:  None   Chief Complaint: Follow-up  History of Present Illness:   Frederick Goodwin is a 75 y.o. male with history of nonobstructive CAD, HFrEF secondary to NICM, frequent PVCs, COVID in 12/2020, HTN, HLD, PUD, ED, and skin cancer who presents for follow-up of his cardiomyopathy.   Echo in 09/2017 showed an EF of 30 to 35% with diffuse hypokinesis, mild mitral regurgitation, and mild left atrial enlargement.  Diagnostic LHC in 09/2017 showed nonobstructive CAD with proximal LAD 40% stenosis, OM1 50% stenosis, and minimal luminal irregularities involving the RCA.  Findings were consistent with nonischemic cardiomyopathy.  Following escalation of GDMT, repeat echo in 01/2018 showed an improvement in his LVEF at 40 to 22%, grade 1 diastolic dysfunction, and a mildly dilated ascending aorta.  HCTZ has previously been discontinued in the setting of mild hyponatremia.  He was seen in the office in 07/2020 noting persistently elevated BP despite escalation of antihypertensive therapy.  Upon comparing one of his home BP cuff's to the office's, it was found his home BP cuff was inaccurate with readings up to 30 mmHg higher than the manual reading.  His Omron BP cuff was relatively consistent with office readings.  No medication changes were made.   He was seen in the office in 11/2020 and was doing well from a cardiac perspective.  Amlodipine was tapered to 5 mg and carvedilol was titrated to 12.5 mg twice daily.  Subsequent echo on 12/29/2020 demonstrated an EF of 40 to 45%, global hypokinesis, grade 2 diastolic dysfunction, low normal RV systolic function and normal RV cavity size, mildly dilated left atrium, trivial mitral regurgitation, mild aortic valve sclerosis without evidence of stenosis, and a mildly dilated aortic root  measuring 39 mm.  He was last seen in the office in 01/2021, and was doing well from a cardiac perspective.  Given persistent cardiomyopathy noted on echo as above, lisinopril was washed out and he was started on Entresto.    He comes in doing well today.  He does feel like he has increased stamina following the initiation of Entresto.  No shortness of breath, chest pain, palpitations, dizziness, presyncope, syncope, lower extremity swelling, or orthopnea.  BP at home is typically in the 297L to 892J systolic.     Labs independently reviewed: 03/2021 - potassium 3.8, BUN 19, SCr 0.9 06/2020 - albumin 4.3, AST/ALT normal Hgb 14, PLT 195, HDL 59, LDL 89, TSH normal   Past Medical History:  Diagnosis Date   Arrhythmia    Arthritis    Cancer ([place])    Erectile dysfunction    History of inguinal hernia repair    right   Hx of ulcer disease    20 YRS AGO   Mental disorder SKIN CANCER   PVC (premature ventricular contraction)    Snores     Past Surgical History:  Procedure Laterality Date   HERNIA REPAIR     RIH as a child   INGUINAL HERNIA REPAIR  08/15/2011   Procedure: LAPAROSCOPIC INGUINAL HERNIA;  Surgeon: Edward Jolly, MD;  Location: [facility];  Service: General;  Laterality: Right;  LAPAROSCOPIC RIGHT INGUINAL HERNIA Repair   LEFT HEART CATH AND CORONARY ANGIOGRAPHY N/A 10/24/2017   Procedure: LEFT HEART CATH AND CORONARY ANGIOGRAPHY;  Surgeon: [name], MD;  Location: [place] [facility];  Service: Cardiovascular;  Laterality: N/A;   TONSILLECTOMY  1966    Current Medications: Current Meds  Medication Sig   Ascorbic Acid (VITAMIN C) 1000 MG tablet Take 1,000 mg by mouth daily. Taking 1 tablet daily   aspirin EC 81 MG tablet Take 1 tablet (81 mg total) by mouth daily.   Coenzyme Q10 (CO Q-10) 100 MG CAPS Take 1 capsule by mouth daily.   omeprazole (PRILOSEC) 20 MG capsule Take 20 mg by mouth daily.    rosuvastatin (CRESTOR) 10 MG tablet TAKE ONE TABLET BY MOUTH DAILY    sacubitril-valsartan (ENTRESTO) 24-26 MG Take 1 tablet by mouth 2 (two) times daily.   spironolactone (ALDACTONE) 25 MG tablet Take 0.5 tablets (12.5 mg total) by mouth daily.   ZINC GLUCONATE PO Take by mouth daily.    Allergies:   Patient has no known allergies.   Social History   Socioeconomic History   Marital status: Married    Spouse name: Not on file   Number of children: Not on file   Years of education: Not on file   Highest education level: Not on file  Occupational History   Not on file  Tobacco Use   Smoking status: Former    Packs/day: 1.00    Years: 30.00    Pack years: 30.00    Types: Cigarettes    Quit date: 1999    Years since quitting: 23.7   Smokeless tobacco: Never  Vaping Use   Vaping Use: Never used  Substance and Sexual Activity   Alcohol use: Yes    Alcohol/week: 8.0 standard drinks    Types: 1 Standard drinks or equivalent, 7 Glasses of wine per week   Drug use: No   Sexual activity: Not on file  Other Topics Concern   Not on file  Social History Narrative   Not on file   Social Determinants of Health   Financial Resource Strain: Not on file  Food Insecurity: Not on file  Transportation Needs: Not on file  Physical Activity: Not on file  Stress: Not on file  Social Connections: Not on file     Family History:  The patient's family history includes COPD in his mother; Cancer in his father; Heart disease in his mother.  ROS:   Review of Systems  Constitutional:  Negative for chills, diaphoresis, fever, malaise/fatigue and weight loss.  HENT:  Negative for congestion.   Eyes:  Negative for discharge and redness.  Respiratory:  Negative for cough, sputum production, shortness of breath and wheezing.   Cardiovascular:  Negative for chest pain, palpitations, orthopnea, claudication, leg swelling and PND.  Gastrointestinal:  Negative for abdominal pain, heartburn, nausea and vomiting.  Musculoskeletal:  Negative for falls and myalgias.   Skin:  Negative for rash.  Neurological:  Negative for dizziness, tingling, tremors, sensory change, speech change, focal weakness, loss of consciousness and weakness.  Endo/Heme/Allergies:  Does not bruise/bleed easily.  Psychiatric/Behavioral:  Negative for substance abuse. The patient is not nervous/anxious.   All other systems reviewed and are negative.   EKGs/Labs/Other Studies Reviewed:    Studies reviewed were summarized above. The additional studies were reviewed today:  2D echo 12/29/2020: 1. Left ventricular ejection fraction, by estimation, is 40 to 45%. Left  ventricular ejection fraction by 3D volume is 45 %. The left ventricle has  mild to moderately decreased function. The left ventricle demonstrates  global hypokinesis. Left  ventricular diastolic parameters are consistent with Grade  II diastolic  dysfunction (pseudonormalization). The average left ventricular global  longitudinal strain is -16.6 %.   2. Right ventricular systolic function is low normal. The right  ventricular size is normal.   3. Left atrial size was mildly dilated.   4. The mitral valve is normal in structure. Trivial mitral valve  regurgitation.   5. The aortic valve is tricuspid. Aortic valve regurgitation is not  visualized. Mild aortic valve sclerosis is present, with no evidence of  aortic valve stenosis.   6. Aortic dilatation noted. There is mild dilatation of the aortic root,  measuring 39 mm. __________   2D echo 01/2018: - Left ventricle: The cavity size was normal. Wall thickness was    increased in a pattern of mild LVH. Systolic function was mildly    to moderately reduced. The estimated ejection fraction was in the    range of 40% to 45%. Diffuse hypokinesis. Doppler parameters are    consistent with abnormal left ventricular relaxation (grade 1    diastolic dysfunction).  - Aorta: Aortic root dimension: 38 mm (ED).  - Ascending aorta: The ascending aorta was mildly dilated.  -  Mitral valve: Calcified annulus.  - Left atrium: The atrium was mildly dilated.   Impressions:   - EF is improved when compared to prior.  __________   LHC 09/2017: Conclusions: Moderate, nonobstructive coronary artery disease involving the proximal LAD and proximal segment of large OM1 branch.  Findings are consistent with nonischemic cardiomyopathy. Mildly elevated left ventricular filling pressure (LVEDP 17 mmHg).   Recommendations: Initiate carvedilol 3.125 mg twice daily for nonischemic cardiomyopathy with moderate to severely reduced LVEF.  Anticipate adding ACEI/ARB/ARNI when patient returns for follow-up in 2 weeks. Continue low-dose aspirin.  Consider adding statin at follow-up appointment to prevent progression of moderate CAD.  We will also check fasting lipid panel and ALT at that time. __________   2D echo 09/2017: - Left ventricle: The cavity size was moderately dilated. Wall    thickness was normal. Systolic function was moderately to    severely reduced. The estimated ejection fraction was in the    range of 30% to 35%. Diffuse hypokinesis. Doppler parameters are    consistent with both elevated ventricular end-diastolic filling    pressure and elevated left atrial filling pressure.  - Mitral valve: Calcified annulus. There was mild regurgitation.  - Left atrium: The atrium was mildly dilated.  - Atrial septum: No defect or patent foramen ovale was identified.    EKG:  EKG is ordered today.  The EKG ordered today demonstrates NSR, 68 bpm, left axis deviation, incomplete RBBB, no significant change when compared to prior tracing  Recent Labs: 03/07/2021: BUN 19; Creatinine, Ser 0.90; Potassium 3.8; Sodium 136  Recent Lipid Panel    Component Value Date/Time   CHOL 147 01/06/2019 0742   TRIG 100 01/06/2019 0742   HDL 61 01/06/2019 0742   CHOLHDL 2.4 01/06/2019 0742   LDLCALC 66 01/06/2019 0742    PHYSICAL EXAM:    VS:  BP (!) 148/82   Pulse 69   Ht 6' (1.829  m)   Wt 216 lb 12.8 oz (98.3 kg)   SpO2 95%   BMI 29.40 kg/m   BMI: Body mass index is 29.4 kg/m.  Physical Exam Vitals reviewed.  Constitutional:      Appearance: He is well-developed.  HENT:     Head: Normocephalic and atraumatic.  Eyes:     General:  Right eye: No discharge.        Left eye: No discharge.  Neck:     Vascular: No JVD.  Cardiovascular:     Rate and Rhythm: Normal rate and regular rhythm.     Pulses:          Posterior tibial pulses are 2+ on the right side and 2+ on the left side.     Heart sounds: Normal heart sounds, S1 normal and S2 normal. Heart sounds not distant. No midsystolic click and no opening snap. No murmur heard.   No friction rub.  Pulmonary:     Effort: Pulmonary effort is normal. No respiratory distress.     Breath sounds: Normal breath sounds. No decreased breath sounds, wheezing or rales.  Chest:     Chest wall: No tenderness.  Abdominal:     General: There is no distension.     Palpations: Abdomen is soft.     Tenderness: There is no abdominal tenderness.  Musculoskeletal:     Cervical back: Normal range of motion.     Right lower leg: No edema.     Left lower leg: No edema.  Skin:    General: Skin is warm and dry.     Nails: There is no clubbing.  Neurological:     Mental Status: He is alert and oriented to person, place, and time.  Psychiatric:        Speech: Speech normal.        Behavior: Behavior normal.        Thought Content: Thought content normal.        Judgment: Judgment normal.    Wt Readings from Last 3 Encounters:  03/31/21 216 lb 12.8 oz (98.3 kg)  02/23/21 213 lb (96.6 kg)  12/07/20 215 lb 4 oz (97.6 kg)     ASSESSMENT & PLAN:   HFrEF secondary to NICM: He appears euvolemic and well compensated with NYHA class I symptoms.  His weight remains stable.  Add spironolactone 12.5 mg daily.  He will otherwise continue carvedilol 12.5 mg twice daily and Entresto 24/26 mg twice daily.  When he is seen in  follow-up, recommend addition of SGLT2i, if not cost prohibitive.  Otherwise, would recommend escalation of current GDMT.  Look to repeat a limited echo in approximately 4 months on maximally tolerated GDMT to reevaluate his LV systolic function.  Nonobstructive CAD: No symptoms concerning for angina.  Continue risk factor modification and current medical therapy including aspirin, carvedilol, and rosuvastatin.  No indication for further ischemic testing at this time.  Frequent PVCs: Quiescent.  He remains on carvedilol.  Dilated aortic root: Stable on recent echo.  HTN: Blood pressure is mildly elevated in the office today, though has been well controlled at home.  Continue medical therapy as outlined above.  HLD: LDL 89 with goal being < 70.  He remains on rosuvastatin 10 mg daily.  Following up optimization of GDMT for his cardiomyopathy as outlined above, we will repeat a lipid panel with recommendation to escalate rosuvastatin accordingly to achieve target LDL.  Disposition: F/u with Dr. Saunders Revel or an APP in 1 month.   Medication Adjustments/Labs and Tests Ordered: Current medicines are reviewed at length with the patient today.  Concerns regarding medicines are outlined above. Medication changes, Labs and Tests ordered today are summarized above and listed in the Patient Instructions accessible in Encounters.   Signed, Christell Faith, PA-C 03/31/2021 11:57 AM     [id number] [place], [place] [id number] [phone]

## 2021-03-29 ENCOUNTER — Telehealth: Payer: Self-pay | Admitting: *Deleted

## 2021-03-29 NOTE — Telephone Encounter (Signed)
Application completed and filed in samples closet.  

## 2021-03-29 NOTE — Telephone Encounter (Signed)
-----   Message from Rise Mu, PA-C sent at 03/08/2021  7:20 AM EDT ----- Renal function and potassium are stable on Entresto. Random glucose is elevated - which can be discussed with his PCP at his next visit with them

## 2021-03-29 NOTE — Telephone Encounter (Signed)
See telephone encounter. Reviewed results and recommendations with patient. No further questions at this time.

## 2021-03-31 ENCOUNTER — Encounter: Payer: Self-pay | Admitting: Physician Assistant

## 2021-03-31 ENCOUNTER — Other Ambulatory Visit: Payer: Self-pay

## 2021-03-31 ENCOUNTER — Ambulatory Visit: Payer: Medicare Other | Admitting: Physician Assistant

## 2021-03-31 VITALS — BP 148/82 | HR 69 | Ht 72.0 in | Wt 216.8 lb

## 2021-03-31 DIAGNOSIS — I77810 Thoracic aortic ectasia: Secondary | ICD-10-CM

## 2021-03-31 DIAGNOSIS — I2510 Atherosclerotic heart disease of native coronary artery without angina pectoris: Secondary | ICD-10-CM | POA: Diagnosis not present

## 2021-03-31 DIAGNOSIS — I10 Essential (primary) hypertension: Secondary | ICD-10-CM

## 2021-03-31 DIAGNOSIS — I5022 Chronic systolic (congestive) heart failure: Secondary | ICD-10-CM | POA: Diagnosis not present

## 2021-03-31 DIAGNOSIS — I428 Other cardiomyopathies: Secondary | ICD-10-CM

## 2021-03-31 DIAGNOSIS — I493 Ventricular premature depolarization: Secondary | ICD-10-CM | POA: Diagnosis not present

## 2021-03-31 DIAGNOSIS — E785 Hyperlipidemia, unspecified: Secondary | ICD-10-CM

## 2021-03-31 MED ORDER — SPIRONOLACTONE 25 MG PO TABS: 12.5000 mg | ORAL_TABLET | Freq: Every day | ORAL | 1 refills | Status: DC

## 2021-03-31 NOTE — Patient Instructions (Signed)
Medication Instructions:  Your physician has recommended you make the following change in your medication:   START Spironolactone 12.5 mg (1/2 tablet) daily. An Rx has been sent to your pharmacy.  *If you need a refill on your cardiac medications before your next appointment, please call your pharmacy*   Lab Work: Your physician recommends that you return for lab work ([place]) in: 1 week  If you have labs (blood work) drawn today and your tests are completely normal, you will receive your results only by: [place] (if you have MyChart) OR A paper copy in the mail If you have any lab test that is abnormal or we need to change your treatment, we will call you to review the results.   Testing/Procedures: None ordered   Follow-Up: At [facility], you and your health needs are our priority.  As part of our continuing mission to provide you with exceptional heart care, we have created designated Provider Care Teams.  These Care Teams include your primary Cardiologist (physician) and Advanced Practice Providers (APPs -  Physician Assistants and Nurse Practitioners) who all work together to provide you with the care you need, when you need it.  We recommend signing up for the patient portal called "MyChart".  Sign up information is provided on this After Visit Summary.  MyChart is used to connect with patients for Virtual Visits (Telemedicine).  Patients are able to view lab/test results, encounter notes, upcoming appointments, etc.  Non-urgent messages can be sent to your provider as well.   To learn more about what you can do with MyChart, go to [email or web address].    Your next appointment:   4 week(s)  The format for your next appointment:   In Person  Provider:   Christell Faith, PA-C   Other Instructions N/A

## 2021-04-05 NOTE — Telephone Encounter (Signed)
Incoming fax from [employer] patient assistance - Patient has been approved for [employer] through the rest of the calendar year.

## 2021-04-07 ENCOUNTER — Other Ambulatory Visit: Payer: Medicare Other

## 2021-04-07 ENCOUNTER — Other Ambulatory Visit (HOSPITAL_COMMUNITY)
Admission: RE | Admit: 2021-04-07 | Discharge: 2021-04-07 | Disposition: A | Discharge status: Home / Self Care | Payer: Medicare Other | Source: Ambulatory Visit | Attending: Physician Assistant | Admitting: Physician Assistant

## 2021-04-07 DIAGNOSIS — I5022 Chronic systolic (congestive) heart failure: Secondary | ICD-10-CM

## 2021-04-07 LAB — BASIC METABOLIC PANEL
Anion gap: 7 (ref 5–15)
BUN: 14 mg/dL (ref 8–23)
CO2: 25 mmol/L (ref 22–32)
Calcium: 8.9 mg/dL (ref 8.9–10.3)
Chloride: 104 mmol/L (ref 98–111)
Creatinine, Ser: 0.85 mg/dL (ref 0.61–1.24)
GFR, Estimated: >60 mL/min (ref >60)
Glucose, Bld: 88 mg/dL (ref 70–99)
Potassium: 4.2 mmol/L (ref 3.5–5.1)
Sodium: 136 mmol/L (ref 135–145)

## 2021-04-27 ENCOUNTER — Encounter: Payer: Self-pay | Admitting: Nurse Practitioner

## 2021-04-27 ENCOUNTER — Other Ambulatory Visit: Payer: Self-pay

## 2021-04-27 ENCOUNTER — Ambulatory Visit: Payer: Medicare Other | Admitting: Nurse Practitioner

## 2021-04-27 VITALS — BP 128/64 | HR 70 | Ht 72.0 in | Wt 211.5 lb

## 2021-04-27 DIAGNOSIS — I5022 Chronic systolic (congestive) heart failure: Secondary | ICD-10-CM | POA: Diagnosis not present

## 2021-04-27 DIAGNOSIS — E782 Mixed hyperlipidemia: Secondary | ICD-10-CM | POA: Diagnosis not present

## 2021-04-27 DIAGNOSIS — I10 Essential (primary) hypertension: Secondary | ICD-10-CM

## 2021-04-27 DIAGNOSIS — I428 Other cardiomyopathies: Secondary | ICD-10-CM

## 2021-04-27 DIAGNOSIS — I2510 Atherosclerotic heart disease of native coronary artery without angina pectoris: Secondary | ICD-10-CM

## 2021-04-27 DIAGNOSIS — I493 Ventricular premature depolarization: Secondary | ICD-10-CM

## 2021-04-27 DIAGNOSIS — I77810 Thoracic aortic ectasia: Secondary | ICD-10-CM

## 2021-04-27 NOTE — Progress Notes (Signed)
Office Visit    Patient Name: Frederick Goodwin Date of Encounter: 04/27/2021  Primary Care Provider:  Antony Contras, MD Primary [name], MD  Chief Complaint    75 year old male with a history of nonobstructive CAD, HFrEF, nonischemic cardiomyopathy, PVCs, hypertension, hyperlipidemia, peptic ulcer disease, erectile dysfunction, skin cancer, and COVID-19 infection in July 2022, who presents for follow-up related to cardiomyopathy.  Past Medical History    Past Medical History:  Diagnosis Date   Arrhythmia    Arthritis    CAD (coronary artery disease)    a. 09/2017 Cath: LM nl, LAD 40p, D1 small/nl, D2 mod/nl, LCX large/nl, OM1 50, RCA min irregs.   Cancer [facility])    Erectile dysfunction    HFrEF (heart failure with reduced ejection fraction) ([place])    a. 09/2017 Echo: EF 30-35%, diff HK; b. 01/2018 Echo: EF 40-45%, diff HK, Gr1 DD; c. 11/2020 Echo: EF 40-45%, glob HK, GRII DD, low-nl RV fxn, mildly dil LA, triv MR, mild AoV sclerosis, Ao root 82mm.   History of inguinal hernia repair    right   Hx of ulcer disease    20 YRS AGO   Mental disorder SKIN CANCER   Nonischemic Cardiomyopathy    a. 09/2017 Echo: EF 30-35%, diff HK; b. 01/2018 Echo: EF 40-45%, diff HK, Gr1 DD; c. 11/2020 Echo: EF 40-45%, glob HK, GRII DD, low-nl RV fxn, mildly dil LA, triv MR, mild AoV sclerosis, Ao root 36mm.   PVC (premature ventricular contraction)    Snores    Past Surgical History:  Procedure Laterality Date   HERNIA REPAIR     RIH as a child   INGUINAL HERNIA REPAIR  08/15/2011   Procedure: LAPAROSCOPIC INGUINAL HERNIA;  Surgeon: Edward Jolly, MD;  Location: [facility];  Service: General;  Laterality: Right;  LAPAROSCOPIC RIGHT INGUINAL HERNIA Repair   LEFT HEART CATH AND CORONARY ANGIOGRAPHY N/A 10/24/2017   Procedure: LEFT HEART CATH AND CORONARY ANGIOGRAPHY;  Surgeon: [name], MD;  Location: [place] [facility];  Service: Cardiovascular;  Laterality: N/A;    TONSILLECTOMY  1966    Allergies  No Known Allergies  History of Present Illness    75 year old male with the above complex past medical history including nonobstructive CAD, HFrEF, nonischemic cardiomyopathy, PVCs, hypertension, hyperlipidemia, peptic ulcer disease, erectile dysfunction, skin cancer, and COVID-19 infection (July 2020).  Previous echocardiogram in April 2019 identified a cardiomyopathy with an EF of 30-35% with diffuse hypokinesis.  Diagnostic catheterization in April 2019 showed nonobstructive CAD with a 40% proximal LAD stenosis and 50% OM1 stenosis.  He was medically managed and has slight improvement in LV function by echo in August 2019 with an EF of 40 to 45%.  Repeat echocardiogram in June 2022 showed EF 4045% with global hypokinesis, grade 2 diastolic dysfunction, low normal RV function, mild aortic valve sclerosis, and stable, mildly dilated aortic root measuring 39 mm.  Given persistent LV dysfunction, at his August visit, lisinopril was discontinued with plan to initiate Entresto.  Mr. Geiman was last seen in cardiology clinic in September, at which time he was feeling well and reported increased stamina following initiation of Entresto.  Though he reported improvement in home blood pressure recordings, he was hypertensive that day at 148/82.  Spironolactone 12.5 mg daily was added and follow-up labs were stable on October 7.  Since his last visit, he has felt well.  As previously noted, he continues to report improved stamina.  He denies  chest pain, palpitations, dyspnea, pnd, orthopnea, n, v, dizziness, syncope, edema, weight gain, or early satiety.  He has been having trouble cutting spironolactone in half and would be interested in increasing if appropriate.  Home Medications    Current Outpatient Medications  Medication Sig Dispense Refill   Ascorbic Acid (VITAMIN C) 1000 MG tablet Take 1,000 mg by mouth daily. Taking 1 tablet daily     aspirin EC 81 MG tablet Take  1 tablet (81 mg total) by mouth daily. 90 tablet 3   carvedilol (COREG) 12.5 MG tablet Take 1 tablet (12.5 mg total) by mouth 2 (two) times daily. 180 tablet 3   Coenzyme Q10 (CO Q-10) 100 MG CAPS Take 1 capsule by mouth daily.     omeprazole (PRILOSEC) 20 MG capsule Take 20 mg by mouth daily.      rosuvastatin (CRESTOR) 10 MG tablet TAKE ONE TABLET BY MOUTH DAILY 90 tablet 1   sacubitril-valsartan (ENTRESTO) 24-26 MG Take 1 tablet by mouth 2 (two) times daily. 180 tablet 3   spironolactone (ALDACTONE) 25 MG tablet Take 0.5 tablets (12.5 mg total) by mouth daily. 45 tablet 1   ZINC GLUCONATE PO Take by mouth daily.     No current facility-administered medications for this visit.     Review of Systems    He denies chest pain, palpitations, dyspnea, pnd, orthopnea, n, v, dizziness, syncope, edema, weight gain, or early satiety.  All other systems reviewed and are otherwise negative except as noted above.  Physical Exam    VS:  BP 128/64 (BP Location: Left Arm, Patient Position: Sitting, Cuff Size: Normal)   Pulse 70   Ht 6' (1.829 m)   Wt 211 lb 8 oz (95.9 kg)   SpO2 98%   BMI 28.68 kg/m  , BMI Body mass index is 28.68 kg/m.     GEN: Well nourished, well developed, in no acute distress. HEENT: normal. Neck: Supple, no JVD, carotid bruits, or masses. Cardiac: RRR, no murmurs, rubs, or gallops. No clubbing, cyanosis, edema.  Radials 2+/PT 1+ and equal bilaterally.  Respiratory:  Respirations regular and unlabored, clear to auscultation bilaterally. GI: Obese, soft, nontender, nondistended, BS + x 4. MS: no deformity or atrophy. Skin: warm and dry, no rash. Neuro:  Strength and sensation are intact. Psych: Normal affect.  Accessory Clinical Findings     Lab Results  Component Value Date   WBC 4.8 10/18/2017   HGB 15.5 10/18/2017   HCT 44.5 10/18/2017   MCV 94 10/18/2017   PLT 196 10/18/2017   Lab Results  Component Value Date   CREATININE 0.85 04/07/2021   BUN 14  04/07/2021   NA 136 04/07/2021   K 4.2 04/07/2021   CL 104 04/07/2021   CO2 25 04/07/2021   Lab Results  Component Value Date   ALT 28 01/06/2019   Lab Results  Component Value Date   CHOL 147 01/06/2019   HDL 61 01/06/2019   LDLCALC 66 01/06/2019   TRIG 100 01/06/2019   CHOLHDL 2.4 01/06/2019     Assessment & Plan    1.  Chronic heart failure with reduced ejection fraction/nonischemic cardiomyopathy:  Patient has been doing well since his visit on September 30.  He continues to note improved stamina and is euvolemic on examination today.  He is tolerating current doses of carvedilol, Entresto, and spironolactone.  He is having some trouble cutting spironolactone in half and wonders if he can take the whole tablet.  His blood pressure  is 128/64, and we agreed to check a basic metabolic panel.  If renal function and potassium is stable, will have him increase spironolactone to 25 mg daily.  In that setting, will defer initiation of SGLT2 inhibitor at this time.  As previously noted, look to repeat echo in the future on maximally tolerated GDMT.  2.  Nonobstructive CAD: Active without angina.  He remains on aspirin, statin, and beta-blockade therapy.  3.  PVCs: Quiescent.  Continue beta-blocker.  4.  Dilated aortic root: Stable on recent echo at 39 mm.  5.  Essential hypertension: Relatively stable at 128/64.  Will consider increasing spironolactone as noted above.  6.  Hyperlipidemia: This followed by primary care.  LDL was 66 in July 2020.  Last note indicates a recording of 89.  Remains on statin therapy.  7.  Disposition: Follow-up basic metabolic panel today.  Follow-up in clinic in 3 months or sooner if necessary.   Murray Hodgkins, NP 04/27/2021, 9:15 AM

## 2021-04-27 NOTE — Patient Instructions (Addendum)
Stamina.  Stamina stamina.  1+ medication Instructions:  No changes at this time.  *If you need a refill on your cardiac medications before your next appointment, please call your pharmacy*   Lab Work: BMET today  If you have labs (blood work) drawn today and your tests are completely normal, you will receive your results only by: [place] (if you have MyChart) OR A paper copy in the mail If you have any lab test that is abnormal or we need to change your treatment, we will call you to review the results.   Testing/Procedures: None   Follow-Up: At [facility], you and your health needs are our priority.  As part of our continuing mission to provide you with exceptional heart care, we have created designated Provider Care Teams.  These Care Teams include your primary Cardiologist (physician) and Advanced Practice Providers (APPs -  Physician Assistants and Nurse Practitioners) who all work together to provide you with the care you need, when you need it.   Your next appointment:   3 month(s)  The format for your next appointment:   In Person  Provider:   [name], MD or Christell Faith, PA-C

## 2021-04-28 ENCOUNTER — Telehealth: Payer: Self-pay | Admitting: *Deleted

## 2021-04-28 DIAGNOSIS — I428 Other cardiomyopathies: Secondary | ICD-10-CM

## 2021-04-28 DIAGNOSIS — I2510 Atherosclerotic heart disease of native coronary artery without angina pectoris: Secondary | ICD-10-CM

## 2021-04-28 DIAGNOSIS — I5022 Chronic systolic (congestive) heart failure: Secondary | ICD-10-CM

## 2021-04-28 LAB — BASIC METABOLIC PANEL
BUN/Creatinine Ratio: 17 (ref 10–24)
BUN: 16 mg/dL (ref 8–27)
CO2: 23 mmol/L (ref 20–29)
Calcium: 9.5 mg/dL (ref 8.6–10.2)
Chloride: 104 mmol/L (ref 96–106)
Creatinine, Ser: 0.95 mg/dL (ref 0.76–1.27)
Glucose: 109 mg/dL — ABNORMAL HIGH (ref 70–99)
Potassium: 4.5 mmol/L (ref 3.5–5.2)
Sodium: 139 mmol/L (ref 134–144)
eGFR: 85 mL/min/{1.73_m2} (ref >59)

## 2021-04-28 MED ORDER — SPIRONOLACTONE 25 MG PO TABS: 25.0000 mg | ORAL_TABLET | Freq: Every day | ORAL | 1 refills | Status: DC

## 2021-04-28 NOTE — Telephone Encounter (Signed)
-----   Message from Theora Gianotti, NP sent at 04/28/2021  7:36 AM EDT ----- Kidney function normal.  Potassium is normal.  As we discussed yesterday, he may begin based on this, he may increase his spironolactone to 1 whole tablet-25 mg daily.  He will need a follow-up basic metabolic panel in 1 week to reevaluate potassium.

## 2021-04-28 NOTE — Telephone Encounter (Signed)
Reviewed results and recommendations with patient. He verbalized understanding to increase spironolactone to whole tablet (25 mg) once daily and have repeat labs in one week. Patient states that he is going out of town and will have those repeat labs done when he returns in [place]. He verbalized understanding of our conversation, agreement with plan, and had no further questions at this time.

## 2021-05-02 ENCOUNTER — Other Ambulatory Visit: Payer: Self-pay | Admitting: *Deleted

## 2021-05-02 MED ORDER — SPIRONOLACTONE 25 MG PO TABS
25.0000 mg | ORAL_TABLET | Freq: Every day | ORAL | 3 refills | Status: DC
Start: 2021-05-02 — End: 2022-05-18

## 2021-05-10 ENCOUNTER — Other Ambulatory Visit: Payer: Self-pay

## 2021-05-10 ENCOUNTER — Other Ambulatory Visit (HOSPITAL_COMMUNITY)
Admission: RE | Admit: 2021-05-10 | Discharge: 2021-05-10 | Disposition: A | Discharge status: Home / Self Care | Payer: Medicare Other | Source: Ambulatory Visit | Attending: Nurse Practitioner | Admitting: Nurse Practitioner

## 2021-05-10 DIAGNOSIS — I428 Other cardiomyopathies: Secondary | ICD-10-CM | POA: Insufficient documentation

## 2021-05-10 DIAGNOSIS — I2510 Atherosclerotic heart disease of native coronary artery without angina pectoris: Secondary | ICD-10-CM | POA: Diagnosis not present

## 2021-05-10 DIAGNOSIS — I5022 Chronic systolic (congestive) heart failure: Secondary | ICD-10-CM | POA: Insufficient documentation

## 2021-05-10 LAB — BASIC METABOLIC PANEL
Anion gap: 11 (ref 5–15)
BUN: 19 mg/dL (ref 8–23)
CO2: 25 mmol/L (ref 22–32)
Calcium: 9 mg/dL (ref 8.9–10.3)
Chloride: 99 mmol/L (ref 98–111)
Creatinine, Ser: 0.91 mg/dL (ref 0.61–1.24)
GFR, Estimated: >60 mL/min (ref >60)
Glucose, Bld: 93 mg/dL (ref 70–99)
Potassium: 5 mmol/L (ref 3.5–5.1)
Sodium: 135 mmol/L (ref 135–145)

## 2021-05-11 ENCOUNTER — Telehealth: Payer: Self-pay | Admitting: *Deleted

## 2021-05-11 DIAGNOSIS — I2510 Atherosclerotic heart disease of native coronary artery without angina pectoris: Secondary | ICD-10-CM

## 2021-05-11 DIAGNOSIS — I5022 Chronic systolic (congestive) heart failure: Secondary | ICD-10-CM

## 2021-05-11 DIAGNOSIS — I428 Other cardiomyopathies: Secondary | ICD-10-CM

## 2021-05-11 NOTE — Telephone Encounter (Signed)
Left voicemail message to call back for review of results and recommendations.  

## 2021-05-11 NOTE — Telephone Encounter (Signed)
-----   Message from Theora Gianotti, NP sent at 05/10/2021  1:26 PM EST ----- Kidney function stable.  Potassium has come up slightly since increasing spironolactone to a whole tab, but is still normal.  I recommend a repeat BMET in 2-3 wks to reassess potassium at that time.

## 2021-05-11 NOTE — Telephone Encounter (Signed)
Reviewed results and recommendations with patient. He requested to have repeat labs done at the [place] office. Advised I would enter the request in system and to call if any problems. Will also send secure message to that office to let them know. Repeat BMP in 2-3 weeks and patient will get after Thanksgiving.

## 2021-05-29 ENCOUNTER — Other Ambulatory Visit (HOSPITAL_COMMUNITY)
Admission: RE | Admit: 2021-05-29 | Discharge: 2021-05-29 | Disposition: A | Discharge status: Home / Self Care | Payer: Medicare Other | Source: Ambulatory Visit | Attending: Nurse Practitioner | Admitting: Nurse Practitioner

## 2021-05-29 DIAGNOSIS — I428 Other cardiomyopathies: Secondary | ICD-10-CM | POA: Insufficient documentation

## 2021-05-29 DIAGNOSIS — I2510 Atherosclerotic heart disease of native coronary artery without angina pectoris: Secondary | ICD-10-CM | POA: Insufficient documentation

## 2021-05-29 DIAGNOSIS — I5022 Chronic systolic (congestive) heart failure: Secondary | ICD-10-CM | POA: Insufficient documentation

## 2021-05-29 LAB — BASIC METABOLIC PANEL
Anion gap: 8 (ref 5–15)
BUN: 17 mg/dL (ref 8–23)
CO2: 22 mmol/L (ref 22–32)
Calcium: 9.1 mg/dL (ref 8.9–10.3)
Chloride: 103 mmol/L (ref 98–111)
Creatinine, Ser: 0.92 mg/dL (ref 0.61–1.24)
GFR, Estimated: >60 mL/min (ref >60)
Glucose, Bld: 93 mg/dL (ref 70–99)
Potassium: 4.5 mmol/L (ref 3.5–5.1)
Sodium: 133 mmol/L — ABNORMAL LOW (ref 135–145)

## 2021-06-05 ENCOUNTER — Telehealth: Payer: Self-pay | Admitting: *Deleted

## 2021-06-05 NOTE — Telephone Encounter (Signed)
-----   Message from Theora Gianotti, NP sent at 05/29/2021  1:11 PM EST ----- Sodium mildly low.  Kidney function and lytes otherwise normal.  Continue current meds.

## 2021-06-05 NOTE — Telephone Encounter (Signed)
Left voicemail message to call back for review of results.  

## 2021-06-06 NOTE — Telephone Encounter (Signed)
Patient dropped off PAF put in box °

## 2021-06-06 NOTE — Telephone Encounter (Signed)
Left voicemail message to call back for review of results.  

## 2021-06-08 NOTE — Telephone Encounter (Signed)
Left voicemail message to call back for review of results. Then noticed he had seen My Chart message with those results and instructions to call back if questions. Closing encounter.

## 2021-06-09 ENCOUNTER — Other Ambulatory Visit: Payer: Self-pay | Admitting: *Deleted

## 2021-06-09 MED ORDER — ENTRESTO 24-26 MG PO TABS: 1.0000 | ORAL_TABLET | Freq: Two times a day (BID) | ORAL | 3 refills | Status: DC

## 2021-07-07 DIAGNOSIS — I10 Essential (primary) hypertension: Secondary | ICD-10-CM | POA: Diagnosis not present

## 2021-07-07 DIAGNOSIS — E7800 Pure hypercholesterolemia, unspecified: Secondary | ICD-10-CM | POA: Diagnosis not present

## 2021-07-07 DIAGNOSIS — Z0000 Encounter for general adult medical examination without abnormal findings: Secondary | ICD-10-CM | POA: Diagnosis not present

## 2021-07-07 DIAGNOSIS — K219 Gastro-esophageal reflux disease without esophagitis: Secondary | ICD-10-CM | POA: Diagnosis not present

## 2021-07-08 DIAGNOSIS — Z1211 Encounter for screening for malignant neoplasm of colon: Secondary | ICD-10-CM | POA: Diagnosis not present

## 2021-07-11 ENCOUNTER — Encounter: Payer: Self-pay | Admitting: Internal Medicine

## 2021-07-13 ENCOUNTER — Telehealth: Payer: Self-pay | Admitting: Physician Assistant

## 2021-07-13 NOTE — Telephone Encounter (Signed)
Letter received from [employer] statin patient has been approved and is eligible to receive Entresto until 07/01/2022 at no cost. Letter placed in files.

## 2021-07-14 ENCOUNTER — Telehealth: Payer: Self-pay | Admitting: *Deleted

## 2021-07-14 NOTE — Telephone Encounter (Signed)
-----   Message from Theora Gianotti, NP sent at 07/12/2021  5:54 PM EST ----- Normal kidney function and electrolytes.

## 2021-07-14 NOTE — Telephone Encounter (Signed)
Left voicemail message yesterday and today to call back for review of results.

## 2021-07-17 ENCOUNTER — Encounter: Payer: Self-pay | Admitting: *Deleted

## 2021-07-17 ENCOUNTER — Telehealth: Payer: Self-pay | Admitting: *Deleted

## 2021-07-17 NOTE — Telephone Encounter (Signed)
Attempt #3 Left voicemail message to call back for review of results. Will complete letter and place in outgoing mail.

## 2021-07-17 NOTE — Telephone Encounter (Signed)
-----   Message from Theora Gianotti, NP sent at 07/12/2021  5:54 PM EST ----- Normal kidney function and electrolytes.

## 2021-07-28 ENCOUNTER — Ambulatory Visit: Payer: Medicare Other | Admitting: Physician Assistant

## 2021-07-31 NOTE — Progress Notes (Signed)
`  Follow-up Outpatient Visit Date: 08/02/2021  Primary Care Provider: Antony Contras, MD [address] [place] [address]  Chief Complaint: Follow-up heart failure and hypertension  HPI:  Mr. Petz is a 76 y.o. male with history of chronic HFrEF due to nonischemic cardiomyopathy in the setting of frequent PVCs, nonobstructive coronary artery disease with moderate proximal LAD stenosis, peptic ulcer disease, erectile dysfunction, and skin cancer, who presents for follow-up of heart failure.  He was last seen in our office in in October by Ignacia Bayley, NP, at which time he was feeling well.  Spironolactone was increased to 25 mg daily at that visit.  Today, Mr. Kolbe reports that he feels fairly well.  His only complaint is of intermittent numbness in the left middle and ring fingers, particularly when he is outside in the cold.  This typically resolves when he warms up his hands.  He believes that he had similar episodes in the right hand last year.  He denies chest pain, shortness of breath, palpitations, lightheadedness, and edema.  Home blood pressures are usually around 130/80.  He is tolerating his medications well.  --------------------------------------------------------------------------------------------------  Cardiovascular History & Procedures: Cardiovascular Problems: PVCs NICM Non-obstructive CAD   Risk Factors: Age greater than 64 and male gender   Cath/PCI: LHC (10/24/17): LMCA normal.  Proximal LAD with 40% stenosis.  OM1 with 50% stenosis.  LCx otherwise normal.  Minimal diffuse disease involving RCA.  LVEDP 17 mmHg.  No aortic stenosis.   CV Surgery: None   EP Procedures and Devices: None   Non-Invasive Evaluation(s): TTE (12/29/20): Normal LV size and wall thickness.  LVEF 40-45% with grade 2 diastolic dysfunction and global hypokinesis.  GLS -16.6%.  Normal RV size and function.  Mild left atrial enlargement.  Trivial mitral regurgitation.   Mildly dilated aortic root, measuring 3.9 cm. TTE (02/13/18): Normal LV size with mild LVH.  LVEF 40-45% with grade 1 diastolic dysfunction.  Mildly dilated ascending aorta (3.9 cm).  Mitral annular calcification.  Normal RV size and function. TTE (10/15/17): Moderately dilated LV with normal wall thickness.  LVEF 30-35% with diffuse hypokinesis.  Elevated filling pressure noted.  MAC with mild MR.  Mild LAE.  Recent CV Pertinent Labs: Lab Results  Component Value Date   CHOL 147 01/06/2019   HDL 61 01/06/2019   LDLCALC 66 01/06/2019   TRIG 100 01/06/2019   CHOLHDL 2.4 01/06/2019   INR 1.1 10/18/2017   K 4.5 05/29/2021   MG 2.3 10/07/2017   BUN 17 05/29/2021   BUN 16 04/27/2021   CREATININE 0.92 05/29/2021    Past medical and surgical history were reviewed and updated in EPIC.  Current Meds  Medication Sig   Ascorbic Acid (VITAMIN C) 1000 MG tablet Take 1,000 mg by mouth daily. Taking 1 tablet daily   aspirin EC 81 MG tablet Take 1 tablet (81 mg total) by mouth daily.   carvedilol (COREG) 12.5 MG tablet Take 1 tablet (12.5 mg total) by mouth 2 (two) times daily.   Coenzyme Q10 (CO Q-10) 100 MG CAPS Take 1 capsule by mouth daily.   omeprazole (PRILOSEC) 20 MG capsule Take 20 mg by mouth daily as needed.   rosuvastatin (CRESTOR) 10 MG tablet TAKE ONE TABLET BY MOUTH DAILY   sacubitril-valsartan (ENTRESTO) 24-26 MG Take 1 tablet by mouth 2 (two) times daily.   spironolactone (ALDACTONE) 25 MG tablet Take 1 tablet (25 mg total) by mouth daily.   ZINC GLUCONATE PO Take  by mouth daily.    Allergies: Patient has no known allergies.  Social History   Tobacco Use   Smoking status: Former    Packs/day: 1.00    Years: 30.00    Pack years: 30.00    Types: Cigarettes    Quit date: 1999    Years since quitting: 24.1   Smokeless tobacco: Never  Vaping Use   Vaping Use: Never used  Substance Use Topics   Alcohol use: Yes    Alcohol/week: 8.0 standard drinks    Types: 1 Standard  drinks or equivalent, 7 Glasses of wine per week   Drug use: No    Family History  Problem Relation Age of Onset   Heart disease Mother        Atrial fibrillation   COPD Mother    Cancer Father        leukemia    Review of Systems: A 12-system review of systems was performed and was negative except as noted in the HPI.  --------------------------------------------------------------------------------------------------  Physical Exam: BP 138/80 (BP Location: Left Arm, Patient Position: Sitting, Cuff Size: Large)    Pulse 65    Ht 6' (1.829 m)    Wt 221 lb (100.2 kg)    SpO2 97%    BMI 29.97 kg/m   General:  NAD. Neck: No JVD or HJR. Lungs: Clear to auscultation bilaterally without wheezes or crackles. Heart: Regular rate and rhythm without murmurs, rubs, or gallops. Abdomen: Soft, nontender, nondistended. Extremities: No lower extremity edema.   Lab Results  Component Value Date   WBC 4.8 10/18/2017   HGB 15.5 10/18/2017   HCT 44.5 10/18/2017   MCV 94 10/18/2017   PLT 196 10/18/2017    Lab Results  Component Value Date   NA 133 (L) 05/29/2021   K 4.5 05/29/2021   CL 103 05/29/2021   CO2 22 05/29/2021   BUN 17 05/29/2021   CREATININE 0.92 05/29/2021   GLUCOSE 93 05/29/2021   ALT 28 01/06/2019    Lab Results  Component Value Date   CHOL 147 01/06/2019   HDL 61 01/06/2019   LDLCALC 66 01/06/2019   TRIG 100 01/06/2019   CHOLHDL 2.4 01/06/2019    --------------------------------------------------------------------------------------------------  ASSESSMENT AND PLAN: Chronic HFrEF due to nonischemic cardiomyopathy: Mr. Homann continues to feel well NYHA class I symptoms.  He appears euvolemic on examination today.  Due to borderline elevated blood pressure today, we have agreed to double Entresto.  I will check a BMP today and again in 2 weeks.  Continue current doses of carvedilol and spironolactone.  Nonobstructive coronary artery disease: No angina  reported.  Continue rosuvastatin 10 mg daily for target LDL less than 70 to prevent progression of disease.  Ongoing lipid follow-up per Dr. Moreen Fowler.  Hypertension: As above, blood pressure is borderline elevated today.  Home readings are also upper normal.  In an effort to optimize his GDMT for heart failure, we will increase Entresto, as above.  Follow-up: Return to clinic in 6 months.  [name], MD 08/02/2021 10:02 AM

## 2021-08-02 ENCOUNTER — Other Ambulatory Visit: Payer: Self-pay

## 2021-08-02 ENCOUNTER — Ambulatory Visit: Payer: Medicare Other | Admitting: Internal Medicine

## 2021-08-02 ENCOUNTER — Encounter: Payer: Self-pay | Admitting: Internal Medicine

## 2021-08-02 VITALS — BP 138/80 | HR 65 | Ht 72.0 in | Wt 221.0 lb

## 2021-08-02 DIAGNOSIS — I2510 Atherosclerotic heart disease of native coronary artery without angina pectoris: Secondary | ICD-10-CM | POA: Diagnosis not present

## 2021-08-02 DIAGNOSIS — I10 Essential (primary) hypertension: Secondary | ICD-10-CM | POA: Diagnosis not present

## 2021-08-02 DIAGNOSIS — I428 Other cardiomyopathies: Secondary | ICD-10-CM

## 2021-08-02 DIAGNOSIS — I5022 Chronic systolic (congestive) heart failure: Secondary | ICD-10-CM

## 2021-08-02 DIAGNOSIS — Z79899 Other long term (current) drug therapy: Secondary | ICD-10-CM | POA: Diagnosis not present

## 2021-08-02 MED ORDER — ENTRESTO 24-26 MG PO TABS: 2.0000 | ORAL_TABLET | Freq: Two times a day (BID) | ORAL | Status: DC

## 2021-08-02 NOTE — Patient Instructions (Signed)
Medication Instructions:   Your physician has recommended you make the following change in your medication:   INCREASE Entresto: Take TWO tablets of current dose (24-26 mg) TWICE daily    - We will send in new Rx to patient assistance after your 2 week lab work returns and we will notify you  *If you need a refill on your cardiac medications before your next appointment, please call your pharmacy*   Lab Work:  1) BMET today in our office  2) Your physician recommends that you return for lab work in: [place] (BMET)      -  You may have this lab drawn at [facility].      -  The lab order is already in place, you may arrive to have drawn.       -  You do NOT have to be fasting for this lab  If you have labs (blood work) drawn today and your tests are completely normal, you will receive your results only by: [place] (if you have MyChart) OR A paper copy in the mail If you have any lab test that is abnormal or we need to change your treatment, we will call you to review the results.   Testing/Procedures:  None ordered   Follow-Up: At [facility], you and your health needs are our priority.  As part of our continuing mission to provide you with exceptional heart care, we have created designated Provider Care Teams.  These Care Teams include your primary Cardiologist (physician) and Advanced Practice Providers (APPs -  Physician Assistants and Nurse Practitioners) who all work together to provide you with the care you need, when you need it.  We recommend signing up for the patient portal called "MyChart".  Sign up information is provided on this After Visit Summary.  MyChart is used to connect with patients for Virtual Visits (Telemedicine).  Patients are able to view lab/test results, encounter notes, upcoming appointments, etc.  Non-urgent messages can be sent to your provider as well.   To learn more about what you can do with MyChart, go to  [email or web address].    Your next appointment:   6 month(s)  The format for your next appointment:   In Person  Provider:   You may see [name], MD or one of the following Advanced Practice Providers on your designated Care Team:   Murray Hodgkins, NP Christell Faith, PA-C Cadence Kathlen Mody, [place]

## 2021-08-03 ENCOUNTER — Telehealth: Payer: Self-pay | Admitting: *Deleted

## 2021-08-03 LAB — BASIC METABOLIC PANEL
BUN/Creatinine Ratio: 14 (ref 10–24)
BUN: 11 mg/dL (ref 8–27)
CO2: 22 mmol/L (ref 20–29)
Calcium: 9.3 mg/dL (ref 8.6–10.2)
Chloride: 104 mmol/L (ref 96–106)
Creatinine, Ser: 0.8 mg/dL (ref 0.76–1.27)
Glucose: 76 mg/dL (ref 70–99)
Potassium: 5.1 mmol/L (ref 3.5–5.2)
Sodium: 137 mmol/L (ref 134–144)
eGFR: 93 mL/min/{1.73_m2} (ref >59)

## 2021-08-03 NOTE — Telephone Encounter (Signed)
Attempted to call pt. No answer. Lmtcb.  

## 2021-08-03 NOTE — Telephone Encounter (Signed)
-----   Message from [name], MD sent at 08/03/2021  7:10 AM EST ----- Please let Frederick Goodwin know that his kidney function remains normal.  His potassium is at the upper end of normal and could increase further with dose escalation of Entresto.  I think it is okay for him to double his dose of Entresto, as we discussed yesterday, with repeat BMP in 2 weeks.  He should try to minimize foods that are high in potassium.

## 2021-08-04 NOTE — Telephone Encounter (Signed)
Attempted to reach both pt and pt's wife (DPR approved).  No answer on either phone. LMTCB x 2.

## 2021-08-07 NOTE — Telephone Encounter (Signed)
Attempted to call both pt and his wife again.  Both numbers go straight to voicemail without ringing.  LMTCB on both numbers.  No other contacts or numbers listed.   After third failed attempt to reach pt, letter mailed asking pt to call our office to discuss results.

## 2021-08-11 NOTE — Telephone Encounter (Signed)
Attempted to reach pt again.   Pt needed 2 week lab appt scheduled at check out and trying to reach him to schedule.   Pt will need refill of new Entresto dose sent via his PAF after 2 week BMET if normal per Dr. Saunders Revel.   Pt also needs 6 month follow up scheduled.

## 2021-08-16 NOTE — Telephone Encounter (Signed)
Spoke with pt. Notified of lab results and Dr. Darnelle Bos recc below.   Pt will continue taking incr dose of Entresto: Take TWO tablets of current dose (24-26 mg) TWICE daily  Pt will have 2 week repeat BMET done today at [facility].  Orders updated.  Pt voiced that he will limit high potassium foods.  We reviewed potassium rich foods to avoid.   Pt aware we will call with lab results and as long as stable we will send in new Rx for incr dose Entesto via PAF.   Pt has no further questions at this time.

## 2021-08-16 NOTE — Addendum Note (Signed)
Addended by: Darlyne Russian on: 08/16/2021 10:50 AM   Modules accepted: Orders

## 2021-08-17 ENCOUNTER — Telehealth: Payer: Self-pay | Admitting: *Deleted

## 2021-08-17 ENCOUNTER — Other Ambulatory Visit (HOSPITAL_COMMUNITY)
Admission: RE | Admit: 2021-08-17 | Discharge: 2021-08-17 | Disposition: A | Discharge status: Home / Self Care | Payer: Medicare Other | Source: Ambulatory Visit | Attending: Internal Medicine | Admitting: Internal Medicine

## 2021-08-17 DIAGNOSIS — I5022 Chronic systolic (congestive) heart failure: Secondary | ICD-10-CM | POA: Insufficient documentation

## 2021-08-17 DIAGNOSIS — I2510 Atherosclerotic heart disease of native coronary artery without angina pectoris: Secondary | ICD-10-CM | POA: Insufficient documentation

## 2021-08-17 DIAGNOSIS — Z79899 Other long term (current) drug therapy: Secondary | ICD-10-CM | POA: Diagnosis not present

## 2021-08-17 DIAGNOSIS — I428 Other cardiomyopathies: Secondary | ICD-10-CM | POA: Insufficient documentation

## 2021-08-17 LAB — BASIC METABOLIC PANEL
Anion gap: 14 (ref 5–15)
BUN: 22 mg/dL (ref 8–23)
CO2: 21 mmol/L — ABNORMAL LOW (ref 22–32)
Calcium: 9.3 mg/dL (ref 8.9–10.3)
Chloride: 101 mmol/L (ref 98–111)
Creatinine, Ser: 1 mg/dL (ref 0.61–1.24)
GFR, Estimated: >60 mL/min (ref >60)
Glucose, Bld: 90 mg/dL (ref 70–99)
Potassium: 4.2 mmol/L (ref 3.5–5.1)
Sodium: 136 mmol/L (ref 135–145)

## 2021-08-17 MED ORDER — ENTRESTO 49-51 MG PO TABS: 1.0000 | ORAL_TABLET | Freq: Two times a day (BID) | ORAL | 3 refills | Status: DC

## 2021-08-17 NOTE — Telephone Encounter (Signed)
-----   Message from [name], MD sent at 08/17/2021  9:35 AM EST ----- Please let Mr. Haq know that his kidney function is stable and his potassium has improved.  I suggest that he continue increased dose of Entresto.  We can send in a new prescription for Entresto 49-51 mg, 1 tablet twice daily.

## 2021-08-17 NOTE — Telephone Encounter (Signed)
Attempted to call pt. No answer. Lmtcb.   New Rx for Entresto 49-51 mg, 1 tablet twice daily - printed and will have signed and fax to [employer] PAF for refills.

## 2021-08-18 ENCOUNTER — Other Ambulatory Visit: Payer: Self-pay | Admitting: Internal Medicine

## 2021-08-18 ENCOUNTER — Telehealth: Payer: Self-pay | Admitting: Internal Medicine

## 2021-08-18 NOTE — Telephone Encounter (Signed)
error 

## 2021-08-18 NOTE — Telephone Encounter (Signed)
Attempted to call both patient and wife. No answer x 2.  LMTCB.   New Rx for Entresto 49-51 mg faxed to [employer] PAF @ [phone].   Pt aware of plan to incr Entresto and send new Rx to PAF as discussed at last visit.  Will discuss further on pt return call.

## 2021-08-18 NOTE — Telephone Encounter (Signed)
°*  STAT* If patient is at the pharmacy, call can be transferred to refill team.   1. Which medications need to be refilled? (please list name of each medication and dose if known) Crestor 10 mg 1 tablet twice a day  2. Which pharmacy/location (including street and city if local pharmacy) is medication to be sent to? [facility] [place] [facility]  3. Do they need a 30 day or 90 day supply? 90 day supply

## 2021-08-18 NOTE — Telephone Encounter (Signed)
Lmovm to verify instructions with pt.

## 2021-08-21 ENCOUNTER — Encounter: Payer: Self-pay | Admitting: Internal Medicine

## 2021-08-21 MED ORDER — ROSUVASTATIN CALCIUM 10 MG PO TABS: 10.0000 mg | ORAL_TABLET | Freq: Every day | ORAL | 1 refills | Status: DC

## 2021-08-21 NOTE — Telephone Encounter (Signed)
Patient calling  Clarified that he takes rosuvastatin 10 MG 1 tablet daily not twice a day

## 2021-08-21 NOTE — Telephone Encounter (Signed)
Requested Prescriptions   Pending Prescriptions Disp Refills   rosuvastatin (CRESTOR) 10 MG tablet 90 tablet 1    Sig: Take 1 tablet (10 mg total) by mouth daily.

## 2021-08-23 NOTE — Telephone Encounter (Signed)
See mychart message 08/21/21 for further detail.

## 2021-12-04 DIAGNOSIS — Z85828 Personal history of other malignant neoplasm of skin: Secondary | ICD-10-CM | POA: Diagnosis not present

## 2021-12-04 DIAGNOSIS — D2262 Melanocytic nevi of left upper limb, including shoulder: Secondary | ICD-10-CM | POA: Diagnosis not present

## 2021-12-04 DIAGNOSIS — L821 Other seborrheic keratosis: Secondary | ICD-10-CM | POA: Diagnosis not present

## 2021-12-04 DIAGNOSIS — L720 Epidermal cyst: Secondary | ICD-10-CM | POA: Diagnosis not present

## 2022-01-08 ENCOUNTER — Other Ambulatory Visit: Payer: Self-pay | Admitting: *Deleted

## 2022-01-08 ENCOUNTER — Encounter: Payer: Self-pay | Admitting: Internal Medicine

## 2022-01-08 MED ORDER — CARVEDILOL 12.5 MG PO TABS: 12.5000 mg | ORAL_TABLET | Freq: Two times a day (BID) | ORAL | 1 refills | Status: DC

## 2022-01-21 ENCOUNTER — Other Ambulatory Visit: Payer: Self-pay | Admitting: Physician Assistant

## 2022-02-11 ENCOUNTER — Other Ambulatory Visit: Payer: Self-pay | Admitting: Internal Medicine

## 2022-02-11 ENCOUNTER — Encounter: Payer: Self-pay | Admitting: Internal Medicine

## 2022-02-12 ENCOUNTER — Other Ambulatory Visit: Payer: Self-pay | Admitting: *Deleted

## 2022-02-12 MED ORDER — ROSUVASTATIN CALCIUM 10 MG PO TABS: 10.0000 mg | ORAL_TABLET | Freq: Every day | ORAL | 0 refills | Status: DC

## 2022-02-16 ENCOUNTER — Ambulatory Visit: Payer: Medicare Other | Admitting: Internal Medicine

## 2022-02-26 DIAGNOSIS — M7062 Trochanteric bursitis, left hip: Secondary | ICD-10-CM | POA: Diagnosis not present

## 2022-02-26 DIAGNOSIS — M47896 Other spondylosis, lumbar region: Secondary | ICD-10-CM | POA: Diagnosis not present

## 2022-02-26 DIAGNOSIS — M545 Low back pain, unspecified: Secondary | ICD-10-CM | POA: Diagnosis not present

## 2022-03-23 ENCOUNTER — Ambulatory Visit: Payer: Medicare Other | Admitting: Physician Assistant

## 2022-04-03 ENCOUNTER — Ambulatory Visit: Payer: Medicare Other | Admitting: Physician Assistant

## 2022-04-06 ENCOUNTER — Other Ambulatory Visit
Admission: RE | Admit: 2022-04-06 | Discharge: 2022-04-06 | Disposition: A | Discharge status: Home / Self Care | Payer: Medicare Other | Source: Ambulatory Visit | Attending: Internal Medicine | Admitting: Internal Medicine

## 2022-04-06 ENCOUNTER — Ambulatory Visit: Payer: Medicare Other | Attending: Internal Medicine | Admitting: Internal Medicine

## 2022-04-06 ENCOUNTER — Encounter: Payer: Self-pay | Admitting: Internal Medicine

## 2022-04-06 VITALS — BP 130/78 | HR 75 | Ht 72.0 in | Wt 211.0 lb

## 2022-04-06 DIAGNOSIS — I493 Ventricular premature depolarization: Secondary | ICD-10-CM | POA: Insufficient documentation

## 2022-04-06 DIAGNOSIS — I428 Other cardiomyopathies: Secondary | ICD-10-CM | POA: Diagnosis not present

## 2022-04-06 DIAGNOSIS — I5022 Chronic systolic (congestive) heart failure: Secondary | ICD-10-CM | POA: Insufficient documentation

## 2022-04-06 DIAGNOSIS — Z79899 Other long term (current) drug therapy: Secondary | ICD-10-CM | POA: Insufficient documentation

## 2022-04-06 DIAGNOSIS — I77810 Thoracic aortic ectasia: Secondary | ICD-10-CM

## 2022-04-06 DIAGNOSIS — I2510 Atherosclerotic heart disease of native coronary artery without angina pectoris: Secondary | ICD-10-CM

## 2022-04-06 DIAGNOSIS — I10 Essential (primary) hypertension: Secondary | ICD-10-CM

## 2022-04-06 LAB — BASIC METABOLIC PANEL
Anion gap: 9 (ref 5–15)
BUN: 16 mg/dL (ref 8–23)
CO2: 24 mmol/L (ref 22–32)
Calcium: 9.3 mg/dL (ref 8.9–10.3)
Chloride: 100 mmol/L (ref 98–111)
Creatinine, Ser: 0.88 mg/dL (ref 0.61–1.24)
GFR, Estimated: >60 mL/min (ref >60)
Glucose, Bld: 99 mg/dL (ref 70–99)
Potassium: 4.4 mmol/L (ref 3.5–5.1)
Sodium: 133 mmol/L — ABNORMAL LOW (ref 135–145)

## 2022-04-06 NOTE — Patient Instructions (Addendum)
Medication Instructions:  Your physician recommends that you continue on your current medications as directed. Please refer to the Current Medication list given to you today.  *If you need a refill on your cardiac medications before your next appointment, please call your pharmacy*   Lab Work: BMET today If you have labs (blood work) drawn today and your tests are completely normal, you will receive your results only by: [place] (if you have MyChart) OR A paper copy in the mail If you have any lab test that is abnormal or we need to change your treatment, we will call you to review the results.   Testing/Procedures: Your physician has requested that you have an echocardiogram prior to 6 month follow up appoinment. Echocardiography is a painless test that uses sound waves to create images of your heart. It provides your doctor with information about the size and shape of your heart and how well your heart's chambers and valves are working. This procedure takes approximately one hour. There are no restrictions for this procedure.    Follow-Up: At [facility], you and your health needs are our priority.  As part of our continuing mission to provide you with exceptional heart care, we have created designated Provider Care Teams.  These Care Teams include your primary Cardiologist (physician) and Advanced Practice Providers (APPs -  Physician Assistants and Nurse Practitioners) who all work together to provide you with the care you need, when you need it.    Your next appointment:   6 month(s) with ECHO prior  The format for your next appointment:   In Person  Provider:   You may see [name], MD or one of the following Advanced Practice Providers on your designated Care Team:   Murray Hodgkins, NP Christell Faith, PA-C Cadence Kathlen Mody, PA-C Gerrie Nordmann, NP   Important Information About Sugar

## 2022-04-06 NOTE — Progress Notes (Signed)
Follow-up Outpatient Visit Date: 04/06/2022  Primary Care Provider: Antony Contras, MD [address] [place] [address]  Chief Complaint: Follow-up nonischemic cardiomyopathy  HPI:  Mr. Kashuba is a 76 y.o. male with history of chronic HFrEF due to nonischemic cardiomyopathy in the setting of frequent PVCs, nonobstructive coronary artery disease with moderate proximal LAD stenosis, peptic ulcer disease, erectile dysfunction, and skin cancer, who presents for follow-up of heart failure.  I last saw him in February, at which time he was feeling well other than intermittent numbness in the left middle and ring fingers exacerbated by cold temperatures.  We agreed to increase Entresto due to borderline elevated blood pressures.  Today, Mr. Romey reports that he feels fairly well.  He has noticed a few episodes of brief orthostatic lightheadedness, which did not worsen with escalation of Entresto.  He denies chest pain, shortness of breath, palpitations, and edema.  Home BP readings are typically lower than today's measurement, as low as 841 mmHg systolic.  --------------------------------------------------------------------------------------------------  Cardiovascular History & Procedures: Cardiovascular Problems: PVCs NICM Non-obstructive CAD   Risk Factors: Age greater than 58 and male gender   Cath/PCI: LHC (10/24/17): LMCA normal.  Proximal LAD with 40% stenosis.  OM1 with 50% stenosis.  LCx otherwise normal.  Minimal diffuse disease involving RCA.  LVEDP 17 mmHg.  No aortic stenosis.   CV Surgery: None   EP Procedures and Devices: None   Non-Invasive Evaluation(s): TTE (12/29/20): Normal LV size and wall thickness.  LVEF 40-45% with grade 2 diastolic dysfunction and global hypokinesis.  GLS -16.6%.  Normal RV size and function.  Mild left atrial enlargement.  Trivial mitral regurgitation.  Mildly dilated aortic root, measuring 3.9 cm. TTE (02/13/18): Normal LV  size with mild LVH.  LVEF 40-45% with grade 1 diastolic dysfunction.  Mildly dilated ascending aorta (3.9 cm).  Mitral annular calcification.  Normal RV size and function. TTE (10/15/17): Moderately dilated LV with normal wall thickness.  LVEF 30-35% with diffuse hypokinesis.  Elevated filling pressure noted.  MAC with mild MR.  Mild LAE.  Recent CV Pertinent Labs: Lab Results  Component Value Date   CHOL 147 01/06/2019   HDL 61 01/06/2019   LDLCALC 66 01/06/2019   TRIG 100 01/06/2019   CHOLHDL 2.4 01/06/2019   INR 1.1 10/18/2017   K 4.2 08/17/2021   MG 2.3 10/07/2017   BUN 22 08/17/2021   BUN 11 08/02/2021   CREATININE 1.00 08/17/2021    Past medical and surgical history were reviewed and updated in EPIC.  Current Meds  Medication Sig   Ascorbic Acid (VITAMIN C) 1000 MG tablet Take 1,000 mg by mouth daily. Taking 1 tablet daily   aspirin EC 81 MG tablet Take 1 tablet (81 mg total) by mouth daily.   carvedilol (COREG) 12.5 MG tablet TAKE 1 TABLET(12.5 MG) BY MOUTH TWICE DAILY   Coenzyme Q10 (CO Q-10) 100 MG CAPS Take 1 capsule by mouth daily.   omeprazole (PRILOSEC) 20 MG capsule Take 20 mg by mouth daily as needed.   rosuvastatin (CRESTOR) 10 MG tablet TAKE 1 TABLET(10 MG) BY MOUTH DAILY   sacubitril-valsartan (ENTRESTO) 49-51 MG Take 1 tablet by mouth 2 (two) times daily.   spironolactone (ALDACTONE) 25 MG tablet Take 1 tablet (25 mg total) by mouth daily.   ZINC GLUCONATE PO Take by mouth daily.    Allergies: Patient has no known allergies.  Social History   Tobacco Use   Smoking status: Former  Packs/day: 1.00    Years: 30.00    Total pack years: 30.00    Types: Cigarettes    Quit date: 50    Years since quitting: 24.7   Smokeless tobacco: Never  Vaping Use   Vaping Use: Never used  Substance Use Topics   Alcohol use: Yes    Alcohol/week: 8.0 standard drinks of alcohol    Types: 1 Standard drinks or equivalent, 7 Glasses of wine per week   Drug use: No     Family History  Problem Relation Age of Onset   Heart disease Mother        Atrial fibrillation   COPD Mother    Cancer Father        leukemia    Review of Systems: A 12-system review of systems was performed and was negative except as noted in the HPI.  --------------------------------------------------------------------------------------------------  Physical Exam: BP 130/78 (BP Location: Left Arm, Patient Position: Sitting, Cuff Size: Normal)   Pulse 75   Ht 6' (1.829 m)   Wt 211 lb (95.7 kg)   SpO2 97%   BMI 28.62 kg/m   General:  NAD. Neck: No JVD or HJR. Lungs: Clear to auscultation bilaterally without wheezes or crackles. Heart: Regular rate and rhythm without murmurs, rubs, or gallops. Abdomen: Soft, nontender, nondistended. Extremities: No lower extremity edema.  EKG:  Normal sinus rhythm with left axis deviation and borderline LBBB.  No significant change since prior tracing on 03/31/2021.  Lab Results  Component Value Date   WBC 4.8 10/18/2017   HGB 15.5 10/18/2017   HCT 44.5 10/18/2017   MCV 94 10/18/2017   PLT 196 10/18/2017    Lab Results  Component Value Date   NA 136 08/17/2021   K 4.2 08/17/2021   CL 101 08/17/2021   CO2 21 (L) 08/17/2021   BUN 22 08/17/2021   CREATININE 1.00 08/17/2021   GLUCOSE 90 08/17/2021   ALT 28 01/06/2019    Lab Results  Component Value Date   CHOL 147 01/06/2019   HDL 61 01/06/2019   LDLCALC 66 01/06/2019   TRIG 100 01/06/2019   CHOLHDL 2.4 01/06/2019    --------------------------------------------------------------------------------------------------  ASSESSMENT AND PLAN: Chronic HFrEF due to NICM: Mr. Dyke continues to do well with NYHA class 1 symptoms.  He notes some transient orthostatic lightheadedness, which did not worsen with escalation of Entresto at our last visit.  Though is BP today would certainly tolerate escalation of his GDMT, we will defer changes given his occasional lightheadedness  or typically lower BP readings at home.  We have agreed to repeat an echo shortly before his f/u visit with me in 6 months.  Based on results and any symptoms that may develop in the meantime, we would need to consider addition of an SGLT2 inhibitor.  We will check a BMP today to ensure stable renal function and electrolytes with ongoing Entresto/spironolactone use.  Dilated aortic root: Mild dilation of aortic root noted on last echo in 11/2020.  We will repeat an echo shortly before our next visit in 6 months to ensure stability.  Coronary artery disease: No angina reported, with cath in 2019 showing moderate, non-obstructive CAD involving the proximal LAD and large OM1 branch.  Continue aspirin and statin therapy for target LDL < 70.  Hypertension: BP borderline elevated today but typically better at home with occasional orthostatic lightheadedness.  We will defer medication changes today.  Follow-up: Return to clinic in 6 months.  [name], MD 04/06/2022 4:24  PM

## 2022-04-07 ENCOUNTER — Encounter: Payer: Self-pay | Admitting: Internal Medicine

## 2022-04-07 DIAGNOSIS — I77810 Thoracic aortic ectasia: Secondary | ICD-10-CM | POA: Insufficient documentation

## 2022-05-11 ENCOUNTER — Other Ambulatory Visit: Payer: Self-pay | Admitting: Cardiovascular Disease

## 2022-05-18 ENCOUNTER — Other Ambulatory Visit: Payer: Self-pay | Admitting: Nurse Practitioner

## 2022-05-30 ENCOUNTER — Telehealth: Payer: Self-pay

## 2022-05-30 NOTE — Telephone Encounter (Signed)
Frederick Goodwin PAF faxed to [employer] for review.

## 2022-06-04 ENCOUNTER — Encounter: Payer: Self-pay | Admitting: Internal Medicine

## 2022-06-04 MED ORDER — ENTRESTO 49-51 MG PO TABS: 1.0000 | ORAL_TABLET | Freq: Two times a day (BID) | ORAL | 0 refills | Status: DC

## 2022-06-04 MED ORDER — ENTRESTO 49-51 MG PO TABS: 1.0000 | ORAL_TABLET | Freq: Two times a day (BID) | ORAL | 0 refills | Status: AC

## 2022-06-04 NOTE — Addendum Note (Signed)
Addended by: Ricci Barker on: 06/04/2022 11:47 AM   Modules accepted: Orders

## 2022-06-07 ENCOUNTER — Ambulatory Visit: Payer: Medicare Other | Admitting: Internal Medicine

## 2022-06-07 NOTE — Telephone Encounter (Signed)
Left message to call back  

## 2022-06-08 NOTE — Telephone Encounter (Signed)
Received fax from [employer] patient assistance stating pt's Frederick Goodwin was approved through 07/02/23. Pt made aware and verbalized understanding.

## 2022-07-30 ENCOUNTER — Encounter: Payer: Self-pay | Admitting: Internal Medicine

## 2022-07-30 MED ORDER — CARVEDILOL 12.5 MG PO TABS: ORAL_TABLET | ORAL | 0 refills | Status: DC

## 2022-08-10 DIAGNOSIS — Z1331 Encounter for screening for depression: Secondary | ICD-10-CM | POA: Diagnosis not present

## 2022-08-10 DIAGNOSIS — I110 Hypertensive heart disease with heart failure: Secondary | ICD-10-CM | POA: Diagnosis not present

## 2022-08-10 DIAGNOSIS — Z23 Encounter for immunization: Secondary | ICD-10-CM | POA: Diagnosis not present

## 2022-08-10 DIAGNOSIS — Z0000 Encounter for general adult medical examination without abnormal findings: Secondary | ICD-10-CM | POA: Diagnosis not present

## 2022-08-10 DIAGNOSIS — I428 Other cardiomyopathies: Secondary | ICD-10-CM | POA: Diagnosis not present

## 2022-08-10 DIAGNOSIS — Z5181 Encounter for therapeutic drug level monitoring: Secondary | ICD-10-CM | POA: Diagnosis not present

## 2022-08-10 DIAGNOSIS — K219 Gastro-esophageal reflux disease without esophagitis: Secondary | ICD-10-CM | POA: Diagnosis not present

## 2022-08-10 DIAGNOSIS — I5022 Chronic systolic (congestive) heart failure: Secondary | ICD-10-CM | POA: Diagnosis not present

## 2022-08-10 DIAGNOSIS — E7800 Pure hypercholesterolemia, unspecified: Secondary | ICD-10-CM | POA: Diagnosis not present

## 2022-08-13 MED ORDER — ROSUVASTATIN CALCIUM 10 MG PO TABS: 10.0000 mg | ORAL_TABLET | Freq: Every day | ORAL | 3 refills | Status: DC

## 2022-08-13 NOTE — Addendum Note (Signed)
Addended by: Meryl Crutch on: 08/13/2022 11:39 AM   Modules accepted: Orders

## 2022-08-27 DIAGNOSIS — Z1211 Encounter for screening for malignant neoplasm of colon: Secondary | ICD-10-CM | POA: Diagnosis not present

## 2022-09-03 DIAGNOSIS — K080 Exfoliation of teeth due to systemic causes: Secondary | ICD-10-CM | POA: Diagnosis not present

## 2022-10-09 ENCOUNTER — Ambulatory Visit: Payer: Medicare Other | Attending: Internal Medicine

## 2022-10-09 DIAGNOSIS — I428 Other cardiomyopathies: Secondary | ICD-10-CM | POA: Diagnosis not present

## 2022-10-09 LAB — ECHOCARDIOGRAM COMPLETE
AR max vel: 2.44 cm2
AV Area VTI: 2.64 cm2
AV Area mean vel: 2.52 cm2
AV Mean grad: 7 mmHg
AV Peak grad: 12.8 mmHg
Ao pk vel: 1.79 m/s
Area-P 1/2: 3.72 cm2
Calc EF: 47 %
S' Lateral: 3.7 cm
Single Plane A2C EF: 48.9 %
Single Plane A4C EF: 42.7 %

## 2022-10-12 ENCOUNTER — Ambulatory Visit: Payer: Medicare Other | Attending: Internal Medicine | Admitting: Internal Medicine

## 2022-10-12 ENCOUNTER — Encounter: Payer: Self-pay | Admitting: Internal Medicine

## 2022-10-12 VITALS — BP 136/74 | HR 64 | Ht 72.0 in | Wt 214.6 lb

## 2022-10-12 DIAGNOSIS — I5022 Chronic systolic (congestive) heart failure: Secondary | ICD-10-CM

## 2022-10-12 DIAGNOSIS — I428 Other cardiomyopathies: Secondary | ICD-10-CM

## 2022-10-12 DIAGNOSIS — I10 Essential (primary) hypertension: Secondary | ICD-10-CM

## 2022-10-12 DIAGNOSIS — I2510 Atherosclerotic heart disease of native coronary artery without angina pectoris: Secondary | ICD-10-CM

## 2022-10-12 NOTE — Progress Notes (Unsigned)
Follow-up Outpatient Visit Date: 10/12/2022  Primary Care Provider: Tally Joe, MD [address] [place] [address]  Chief Complaint: Follow-up HFrEF  HPI:  Mr. Armstead is a 77 y.o. male with history of chronic HFrEF due to nonischemic cardiomyopathy in the setting of frequent PVCs, nonobstructive coronary artery disease with moderate proximal LAD stenosis, peptic ulcer disease, erectile dysfunction, and skin cancer, who presents for follow-up of HFrEF.  I last saw Mr. Magsino in 04/2022, at which time he was feeling well other than a few brief episodes of orthostatic lightheadedness.  We did not make any medication changes at that time.  Follow-up echocardiogram earlier this week showed relatively stable mild LV dysfunction with EF of 45-50%.  Severe left atrial enlargement was noted, as well as borderline dilation of the ascending aorta.  Today, Mr. Ferdinand reports that he feels well.  He has not had a chest pain, shortness of breath, palpitations, lightheadedness, or edema.  He is trying to walk up to 2 miles a day, usually 5 days a week.  He is not checking his blood pressure regularly at home but notes that it is typically around 130 systolic when he does.  He is tolerating his medications well.  --------------------------------------------------------------------------------------------------  Cardiovascular History & Procedures: Cardiovascular Problems: PVCs NICM Non-obstructive CAD   Risk Factors: Age greater than 13 and male gender   Cath/PCI: LHC (10/24/17): LMCA normal.  Proximal LAD with 40% stenosis.  OM1 with 50% stenosis.  LCx otherwise normal.  Minimal diffuse disease involving RCA.  LVEDP 17 mmHg.  No aortic stenosis.   CV Surgery: None   EP Procedures and Devices: None   Non-Invasive Evaluation(s): TTE (10/09/2022): Normal LV size and wall thickness.  LVEF 45-50% with no regional wall motion abnormalities.  Grade 1 diastolic dysfunction noted.   Global longitudinal strain -15.8%).  Normal RV size and function.  Severe left atrial enlargement.  Mild mitral annular calcification with mild mitral regurgitation.  Aortic sclerosis without stenosis.  Borderline dilated ascending aorta (3.7 cm at the root, 3.8 cm in the proximal ascending aorta).  Normal CVP. TTE (12/29/20): Normal LV size and wall thickness.  LVEF 40-45% with grade 2 diastolic dysfunction and global hypokinesis.  GLS -16.6%.  Normal RV size and function.  Mild left atrial enlargement.  Trivial mitral regurgitation.  Mildly dilated aortic root, measuring 3.9 cm. TTE (02/13/18): Normal LV size with mild LVH.  LVEF 40-45% with grade 1 diastolic dysfunction.  Mildly dilated ascending aorta (3.9 cm).  Mitral annular calcification.  Normal RV size and function. TTE (10/15/17): Moderately dilated LV with normal wall thickness.  LVEF 30-35% with diffuse hypokinesis.  Elevated filling pressure noted.  MAC with mild MR.  Mild LAE.  Recent CV Pertinent Labs: Lab Results  Component Value Date   CHOL 147 01/06/2019   HDL 61 01/06/2019   LDLCALC 66 01/06/2019   TRIG 100 01/06/2019   CHOLHDL 2.4 01/06/2019   INR 1.1 10/18/2017   K 4.4 04/06/2022   MG 2.3 10/07/2017   BUN 16 04/06/2022   BUN 11 08/02/2021   CREATININE 0.88 04/06/2022    Past medical and surgical history were reviewed and updated in EPIC.  Current Meds  Medication Sig   Ascorbic Acid (VITAMIN C) 1000 MG tablet Take 1,000 mg by mouth daily. Taking 1 tablet daily   aspirin EC 81 MG tablet Take 1 tablet (81 mg total) by mouth daily.   carvedilol (COREG) 12.5 MG tablet TAKE 1 TABLET(12.5  MG) BY MOUTH TWICE DAILY   Coenzyme Q10 (CO Q-10) 100 MG CAPS Take 1 capsule by mouth daily.   omeprazole (PRILOSEC) 20 MG capsule Take 20 mg by mouth daily as needed.   rosuvastatin (CRESTOR) 10 MG tablet Take 1 tablet (10 mg total) by mouth daily.   sacubitril-valsartan (ENTRESTO) 49-51 MG Take 1 tablet by mouth 2 (two) times daily.    spironolactone (ALDACTONE) 25 MG tablet TAKE 1 TABLET BY MOUTH ONCE DAILY.   ZINC GLUCONATE PO Take by mouth daily.    Allergies: Patient has no known allergies.  Social History   Tobacco Use   Smoking status: Former    Packs/day: 1.00    Years: 30.00    Additional pack years: 0.00    Total pack years: 30.00    Types: Cigarettes    Quit date: 1999    Years since quitting: 25.2   Smokeless tobacco: Never  Vaping Use   Vaping Use: Never used  Substance Use Topics   Alcohol use: Yes    Alcohol/week: 8.0 standard drinks of alcohol    Types: 1 Standard drinks or equivalent, 7 Glasses of wine per week   Drug use: No    Family History  Problem Relation Age of Onset   Heart disease Mother        Atrial fibrillation   COPD Mother    Cancer Father        leukemia    Review of Systems: A 12-system review of systems was performed and was negative except as noted in the HPI.  --------------------------------------------------------------------------------------------------  Physical Exam: BP 136/74 (BP Location: Left Arm)   Pulse 64   Ht 6' (1.829 m)   Wt 214 lb 9.6 oz (97.3 kg)   BMI 29.10 kg/m   General:  NAD. Neck: No JVD or HJR. Lungs: Clear to auscultation bilaterally without wheezes or crackles. Heart: Regular rate and rhythm without murmurs, rubs, or gallops. Abdomen: Soft, nontender, nondistended. Extremities: No lower extremity edema.  EKG: Normal sinus rhythm with first-degree AV block and left axis deviation.  Heart rate has decreased since 04/06/2022.  Otherwise, no significant interval change.  Lab Results  Component Value Date   WBC 4.8 10/18/2017   HGB 15.5 10/18/2017   HCT 44.5 10/18/2017   MCV 94 10/18/2017   PLT 196 10/18/2017    Lab Results  Component Value Date   NA 133 (L) 04/06/2022   K 4.4 04/06/2022   CL 100 04/06/2022   CO2 24 04/06/2022   BUN 16 04/06/2022   CREATININE 0.88 04/06/2022   GLUCOSE 99 04/06/2022   ALT 28 01/06/2019     Lab Results  Component Value Date   CHOL 147 01/06/2019   HDL 61 01/06/2019   LDLCALC 66 01/06/2019   TRIG 100 01/06/2019   CHOLHDL 2.4 01/06/2019    --------------------------------------------------------------------------------------------------  ASSESSMENT AND PLAN: Chronic HFrEF due to nonischemic cardiomyopathy: Mr. Licausi continues to feel well NYHA class I symptoms.  Recent echocardiogram showed LVEF of 45-50%, which continues to be a gradual improvement.  Will continue his current GDMT consisting of carvedilol, Entresto, and spironolactone.  We will reach out to his PCPs office for a copy of his most recent labs.  If he has not had a BMP in the last few months, one will need to be repeated at Mr. Bates convenience.  Nonobstructive coronary artery disease: No angina reported.  Lipids well-controlled on last check labs from Drs. To ensure that LDL is adequately controlled on  current dose of rosuvastatin (goal less than 70).  Hypertension: Blood pressure borderline elevated today, usually a bit better when checked at home.  Continue current regimen of carvedilol, spironolactone, and Entresto.  Sodium restriction encouraged.  Follow-up: Return to clinic in 1 year.  Yvonne Kendall, MD 10/12/2022 11:19 AM

## 2022-10-12 NOTE — Patient Instructions (Signed)
Medication Instructions:  Your Physician recommend you continue on your current medication as directed.    *If you need a refill on your cardiac medications before your next appointment, please call your pharmacy*   Lab Work: None ordered    Testing/Procedures: None ordered    Follow-Up: At [facility], you and your health needs are our priority.  As part of our continuing mission to provide you with exceptional heart care, we have created designated Provider Care Teams.  These Care Teams include your primary Cardiologist (physician) and Advanced Practice Providers (APPs -  Physician Assistants and Nurse Practitioners) who all work together to provide you with the care you need, when you need it.  We recommend signing up for the patient portal called "MyChart".  Sign up information is provided on this After Visit Summary.  MyChart is used to connect with patients for Virtual Visits (Telemedicine).  Patients are able to view lab/test results, encounter notes, upcoming appointments, etc.  Non-urgent messages can be sent to your provider as well.   To learn more about what you can do with MyChart, go to [email or web address].    Your next appointment:   1 year(s)  Provider:   You may see Yvonne Kendall, MD or one of the following Advanced Practice Providers on your designated Care Team:   Nicolasa Ducking, NP Eula Listen, PA-C Cadence Fransico Michael, PA-C Charlsie Quest, NP    DASH Eating Plan DASH stands for Dietary Approaches to Stop Hypertension. The DASH eating plan is a healthy eating plan that has been shown to: Reduce high blood pressure (hypertension). Reduce your risk for type 2 diabetes, heart disease, and stroke. Help with weight loss. What are tips for following this plan? Reading food labels Check food labels for the amount of salt (sodium) per serving. Choose foods with less than 5 percent of the Daily Value of sodium. Generally, foods with less than 300 milligrams  (mg) of sodium per serving fit into this eating plan. To find whole grains, look for the word "whole" as the first word in the ingredient list. Shopping Buy products labeled as "low-sodium" or "no salt added." Buy fresh foods. Avoid canned foods and pre-made or frozen meals. Cooking Avoid adding salt when cooking. Use salt-free seasonings or herbs instead of table salt or sea salt. Check with your health care provider or pharmacist before using salt substitutes. Do not fry foods. Cook foods using healthy methods such as baking, boiling, grilling, roasting, and broiling instead. Cook with heart-healthy oils, such as olive, canola, avocado, soybean, or sunflower oil. Meal planning  Eat a balanced diet that includes: 4 or more servings of fruits and 4 or more servings of vegetables each day. Try to fill one-half of your plate with fruits and vegetables. 6-8 servings of whole grains each day. Less than 6 oz (170 g) of lean meat, poultry, or fish each day. A 3-oz (85-g) serving of meat is about the same size as a deck of cards. One egg equals 1 oz (28 g). 2-3 servings of low-fat dairy each day. One serving is 1 cup (237 mL). 1 serving of nuts, seeds, or beans 5 times each week. 2-3 servings of heart-healthy fats. Healthy fats called omega-3 fatty acids are found in foods such as walnuts, flaxseeds, fortified milks, and eggs. These fats are also found in cold-water fish, such as sardines, salmon, and mackerel. Limit how much you eat of: Canned or prepackaged foods. Food that is high in trans fat, such as some  fried foods. Food that is high in saturated fat, such as fatty meat. Desserts and other sweets, sugary drinks, and other foods with added sugar. Full-fat dairy products. Do not salt foods before eating. Do not eat more than 4 egg yolks a week. Try to eat at least 2 vegetarian meals a week. Eat more home-cooked food and less restaurant, buffet, and fast food. Lifestyle When eating at a  restaurant, ask that your food be prepared with less salt or no salt, if possible. If you drink alcohol: Limit how much you use to: 0-1 drink a day for women who are not pregnant. 0-2 drinks a day for men. Be aware of how much alcohol is in your drink. In the [place] one drink equals one 12 oz bottle of beer (355 mL), one 5 oz glass of wine (148 mL), or one 1 oz glass of hard liquor (44 mL). General information Avoid eating more than 2,300 mg of salt a day. If you have hypertension, you may need to reduce your sodium intake to 1,500 mg a day. Work with your health care provider to maintain a healthy body weight or to lose weight. Ask what an ideal weight is for you. Get at least 30 minutes of exercise that causes your heart to beat faster (aerobic exercise) most days of the week. Activities may include walking, swimming, or biking. Work with your health care provider or dietitian to adjust your eating plan to your individual calorie needs. What foods should I eat? Fruits All fresh, dried, or frozen fruit. Canned fruit in natural juice (without added sugar). Vegetables Fresh or frozen vegetables (raw, steamed, roasted, or grilled). Low-sodium or reduced-sodium tomato and vegetable juice. Low-sodium or reduced-sodium tomato sauce and tomato paste. Low-sodium or reduced-sodium canned vegetables. Grains Whole-grain or whole-wheat bread. Whole-grain or whole-wheat pasta. Brown rice. Orpah Cobb. Bulgur. Whole-grain and low-sodium cereals. Pita bread. Low-fat, low-sodium crackers. Whole-wheat flour tortillas. Meats and other proteins Skinless chicken or [place]. Ground chicken or [place]. Pork with fat trimmed off. Fish and seafood. Egg whites. Dried beans, peas, or lentils. Unsalted nuts, nut butters, and seeds. Unsalted canned beans. Lean cuts of beef with fat trimmed off. Low-sodium, lean precooked or cured meat, such as sausages or meat loaves. Dairy Low-fat (1%) or fat-free (skim) milk.  Reduced-fat, low-fat, or fat-free cheeses. Nonfat, low-sodium ricotta or cottage cheese. Low-fat or nonfat yogurt. Low-fat, low-sodium cheese. Fats and oils Soft margarine without trans fats. Vegetable oil. Reduced-fat, low-fat, or light mayonnaise and salad dressings (reduced-sodium). Canola, safflower, olive, avocado, soybean, and sunflower oils. Avocado. Seasonings and condiments Herbs. Spices. Seasoning mixes without salt. Other foods Unsalted popcorn and pretzels. Fat-free sweets. The items listed above may not be a complete list of foods and beverages you can eat. Contact a dietitian for more information. What foods should I avoid? Fruits Canned fruit in a light or heavy syrup. Fried fruit. Fruit in cream or butter sauce. Vegetables Creamed or fried vegetables. Vegetables in a cheese sauce. Regular canned vegetables (not low-sodium or reduced-sodium). Regular canned tomato sauce and paste (not low-sodium or reduced-sodium). Regular tomato and vegetable juice (not low-sodium or reduced-sodium). Rosita Fire. Olives. Grains Baked goods made with fat, such as croissants, muffins, or some breads. Dry pasta or rice meal packs. Meats and other proteins Fatty cuts of meat. Ribs. Fried meat. Tomasa Blase. Bologna, salami, and other precooked or cured meats, such as sausages or meat loaves. Fat from the back of a pig (fatback). Bratwurst. Salted nuts and seeds. Canned beans with  added salt. Canned or smoked fish. Whole eggs or egg yolks. Chicken or [place] with skin. Dairy Whole or 2% milk, cream, and half-and-half. Whole or full-fat cream cheese. Whole-fat or sweetened yogurt. Full-fat cheese. Nondairy creamers. Whipped toppings. Processed cheese and cheese spreads. Fats and oils Butter. Stick margarine. Lard. Shortening. Ghee. Bacon fat. Tropical oils, such as coconut, palm kernel, or palm oil. Seasonings and condiments Onion salt, garlic salt, seasoned salt, table salt, and sea salt. Worcestershire sauce.  Tartar sauce. Barbecue sauce. Teriyaki sauce. Soy sauce, including reduced-sodium. Steak sauce. Canned and packaged gravies. Fish sauce. Oyster sauce. Cocktail sauce. Store-bought horseradish. Ketchup. Mustard. Meat flavorings and tenderizers. Bouillon cubes. Hot sauces. Pre-made or packaged marinades. Pre-made or packaged taco seasonings. Relishes. Regular salad dressings. Other foods Salted popcorn and pretzels. The items listed above may not be a complete list of foods and beverages you should avoid. Contact a dietitian for more information. Where to find more information National Heart, Lung, and Blood Institute: PopSteam.is American Heart Association: www.heart.org Academy of Nutrition and Dietetics: www.eatright.org National Kidney Foundation: www.kidney.org Summary The DASH eating plan is a healthy eating plan that has been shown to reduce high blood pressure (hypertension). It may also reduce your risk for type 2 diabetes, heart disease, and stroke. When on the DASH eating plan, aim to eat more fresh fruits and vegetables, whole grains, lean proteins, low-fat dairy, and heart-healthy fats. With the DASH eating plan, you should limit salt (sodium) intake to 2,300 mg a day. If you have hypertension, you may need to reduce your sodium intake to 1,500 mg a day. Work with your health care provider or dietitian to adjust your eating plan to your individual calorie needs. This information is not intended to replace advice given to you by your health care provider. Make sure you discuss any questions you have with your health care provider. Document Revised: 05/22/2019 Document Reviewed: 05/22/2019 Elsevier Patient Education  2023 [employer].

## 2022-10-14 ENCOUNTER — Encounter: Payer: Self-pay | Admitting: Internal Medicine

## 2022-10-21 ENCOUNTER — Encounter: Payer: Self-pay | Admitting: Internal Medicine

## 2022-10-22 MED ORDER — CARVEDILOL 12.5 MG PO TABS: ORAL_TABLET | ORAL | 3 refills | Status: DC

## 2022-12-17 DIAGNOSIS — L570 Actinic keratosis: Secondary | ICD-10-CM | POA: Diagnosis not present

## 2022-12-17 DIAGNOSIS — Z85828 Personal history of other malignant neoplasm of skin: Secondary | ICD-10-CM | POA: Diagnosis not present

## 2022-12-17 DIAGNOSIS — D225 Melanocytic nevi of trunk: Secondary | ICD-10-CM | POA: Diagnosis not present

## 2022-12-17 DIAGNOSIS — L723 Sebaceous cyst: Secondary | ICD-10-CM | POA: Diagnosis not present

## 2022-12-17 DIAGNOSIS — D2261 Melanocytic nevi of right upper limb, including shoulder: Secondary | ICD-10-CM | POA: Diagnosis not present

## 2022-12-17 DIAGNOSIS — D485 Neoplasm of uncertain behavior of skin: Secondary | ICD-10-CM | POA: Diagnosis not present

## 2023-02-20 ENCOUNTER — Other Ambulatory Visit: Payer: Self-pay | Admitting: *Deleted

## 2023-02-20 ENCOUNTER — Encounter: Payer: Self-pay | Admitting: Internal Medicine

## 2023-02-20 MED ORDER — SPIRONOLACTONE 25 MG PO TABS: 25.0000 mg | ORAL_TABLET | Freq: Every day | ORAL | 2 refills | Status: DC

## 2023-02-21 DIAGNOSIS — U071 COVID-19: Secondary | ICD-10-CM | POA: Diagnosis not present

## 2023-03-13 DIAGNOSIS — K080 Exfoliation of teeth due to systemic causes: Secondary | ICD-10-CM | POA: Diagnosis not present

## 2023-05-08 ENCOUNTER — Telehealth: Payer: Self-pay | Admitting: *Deleted

## 2023-05-08 NOTE — Telephone Encounter (Signed)
Patient assistance application received an pending review and signature.

## 2023-05-08 NOTE — Telephone Encounter (Signed)
Application received and pending review.    Placed in pending patient assistance applications

## 2023-05-10 NOTE — Telephone Encounter (Signed)
Application was faxed to company

## 2023-06-11 ENCOUNTER — Encounter: Payer: Self-pay | Admitting: Internal Medicine

## 2023-06-17 ENCOUNTER — Telehealth: Payer: Self-pay | Admitting: Internal Medicine

## 2023-06-17 MED ORDER — ENTRESTO 49-51 MG PO TABS: 1.0000 | ORAL_TABLET | Freq: Two times a day (BID) | ORAL | Status: DC

## 2023-06-17 NOTE — Telephone Encounter (Signed)
Pt c/o medication issue:  1. Name of Medication: Entresto 49-51 MG   2. How are you currently taking this medication (dosage and times per day)?   3. Are you having a reaction (difficulty breathing--STAT)?   4. What is your medication issue? Patient is returning call in regards to this medication and refill. States that his pharmacy says it has not been received. He would like to also get update on application. Please advise.

## 2023-06-17 NOTE — Telephone Encounter (Signed)
Called and notified patient that order was completed and they were going to get the medication out to him. He had no further needs.

## 2023-06-17 NOTE — Telephone Encounter (Signed)
Spoke with patient and reviewed medication & application. Advised that application is still pending. He reports that company has not gotten the request for refill of current approval. Advised that I would take care of that for him and if he should have any further concerns to give [place] a call back.   Called patient assistance foundation and gave a verbal order for patients medication refill.

## 2023-07-29 ENCOUNTER — Other Ambulatory Visit: Payer: Self-pay

## 2023-07-29 MED ORDER — ROSUVASTATIN CALCIUM 10 MG PO TABS: 10.0000 mg | ORAL_TABLET | Freq: Every day | ORAL | 0 refills | Status: DC

## 2023-07-29 NOTE — Telephone Encounter (Signed)
Last office visit: 10/12/22 with plan to f/u 12 months. next office visit: none/active recall   Requested Prescriptions   Signed Prescriptions Disp Refills   rosuvastatin (CRESTOR) 10 MG tablet 90 tablet 0    Sig: Take 1 tablet (10 mg total) by mouth daily.    Authorizing Provider: END, CHRISTOPHER    Ordering User: Guerry Minors

## 2023-08-26 DIAGNOSIS — E7800 Pure hypercholesterolemia, unspecified: Secondary | ICD-10-CM | POA: Diagnosis not present

## 2023-08-26 DIAGNOSIS — Z1331 Encounter for screening for depression: Secondary | ICD-10-CM | POA: Diagnosis not present

## 2023-08-26 DIAGNOSIS — Z0000 Encounter for general adult medical examination without abnormal findings: Secondary | ICD-10-CM | POA: Diagnosis not present

## 2023-08-26 DIAGNOSIS — I10 Essential (primary) hypertension: Secondary | ICD-10-CM | POA: Diagnosis not present

## 2023-08-29 DIAGNOSIS — Z1211 Encounter for screening for malignant neoplasm of colon: Secondary | ICD-10-CM | POA: Diagnosis not present

## 2023-09-16 DIAGNOSIS — K080 Exfoliation of teeth due to systemic causes: Secondary | ICD-10-CM | POA: Diagnosis not present

## 2023-10-04 ENCOUNTER — Other Ambulatory Visit: Payer: Self-pay

## 2023-10-04 MED ORDER — CARVEDILOL 12.5 MG PO TABS: ORAL_TABLET | ORAL | 0 refills | Status: DC

## 2023-10-14 ENCOUNTER — Other Ambulatory Visit: Payer: Self-pay | Admitting: Internal Medicine

## 2023-10-15 NOTE — Progress Notes (Unsigned)
 Cardiology Office Note    Date:  10/16/2023   ID:  Frederick Goodwin, DOB 11-15-1945, MRN [id number]  PCP:  Tally Joe, MD  Cardiologist:  Yvonne Kendall, MD  Electrophysiologist:  None   Chief Complaint: Follow-up  History of Present Illness:   Frederick Goodwin is a 78 y.o. male with history of nonobstructive CAD with moderate proximal LAD stenosis, HFmrEF secondary to NICM in the setting of frequent PVCs, HTN, peptic ulcer disease, skin cancer, and erectile dysfunction who presents for follow-up of his cardiomyopathy.  Echo in 09/2017 showed an EF of 30 to 35% with diffuse hypokinesis and mild mitral regurgitation.  Diagnostic LHC in 09/2017 showed nonobstructive CAD with proximal LAD 40% stenosis, OM1 50% stenosis, and minimal luminal irregularities involving the RCA.  Findings were consistent with nonischemic cardiomyopathy.  Following escalation of GDMT, repeat echo in 01/2018 showed an improvement in his LVEF at 40 to 45%, grade 1 diastolic dysfunction, and a mildly dilated ascending aorta.  Echo from 11/2020 showed an EF of 40 to 45%, global hypokinesis, grade 2 diastolic dysfunction, low normal RV systolic function with normal ventricular cavity size, mildly dilated left atrium, trivial mitral regurgitation, mild aortic valve sclerosis without evidence of stenosis, and mild dilatation of the aortic root measuring 39 mm.  Most recent echo from 10/09/2022 showed an EF of 45 to 50%, no regional wall motion abnormalities, grade 1 diastolic dysfunction, normal RV systolic function and ventricular cavity size, severely dilated left atrium, mild mitral regurgitation, aortic valve sclerosis without evidence of stenosis, borderline dilatation of the aortic root and ascending aorta measuring 37 and 38 mm respectively, and an estimated right atrial pressure of 3 mmHg.  He was last seen in the office in 10/2022 and was without symptoms of angina or cardiac decompensation.  He was trying to walk up to 2 miles  per day, typically 5 days/week.  No changes in pharmacotherapy were indicated at that time.  He comes in doing well from a cardiac perspective and is without symptoms of angina or cardiac decompensation.  No dizziness, presyncope, or syncope.  No significant lower extremity swelling or progressive orthopnea.  No falls or symptoms concerning for bleeding.  Adherent and tolerating cardiac medications without off target effect.  Patient reports he had routine labs obtained at his PCP's office last month and indicates there were no significant abnormalities.  He does not have any acute cardiac concerns at this time.   Labs independently reviewed: 04/2022 - potassium 4.4, BUN 16, serum creatinine 0.88  Past Medical History:  Diagnosis Date   Arrhythmia    Arthritis    CAD (coronary artery disease)    a. 09/2017 Cath: LM nl, LAD 40p, D1 small/nl, D2 mod/nl, LCX large/nl, OM1 50, RCA min irregs.   Cancer [facility])    Erectile dysfunction    HFrEF (heart failure with reduced ejection fraction) (HCC)    a. 09/2017 Echo: EF 30-35%, diff HK; b. 01/2018 Echo: EF 40-45%, diff HK, Gr1 DD; c. 11/2020 Echo: EF 40-45%, glob HK, GRII DD, low-nl RV fxn, mildly dil LA, triv MR, mild AoV sclerosis, Ao root 39mm.   History of inguinal hernia repair    right   Hx of ulcer disease    20 YRS AGO   Mental disorder SKIN CANCER   Nonischemic Cardiomyopathy    a. 09/2017 Echo: EF 30-35%, diff HK; b. 01/2018 Echo: EF 40-45%, diff HK, Gr1 DD; c. 11/2020 Echo: EF 40-45%, glob HK, GRII DD, low-nl  RV fxn, mildly dil LA, triv MR, mild AoV sclerosis, Ao root 39mm.   PVC (premature ventricular contraction)    Snores     Past Surgical History:  Procedure Laterality Date   HERNIA REPAIR     RIH as a child   INGUINAL HERNIA REPAIR  08/15/2011   Procedure: LAPAROSCOPIC INGUINAL HERNIA;  Surgeon: Quitman Bucy, MD;  Location: [facility] ORS;  Service: General;  Laterality: Right;  LAPAROSCOPIC RIGHT INGUINAL HERNIA Repair   LEFT HEART  CATH AND CORONARY ANGIOGRAPHY N/A 10/24/2017   Procedure: LEFT HEART CATH AND CORONARY ANGIOGRAPHY;  Surgeon: Sammy Crisp, MD;  Location: [facility] INVASIVE CV LAB;  Service: Cardiovascular;  Laterality: N/A;   TONSILLECTOMY  1966    Current Medications: Current Meds  Medication Sig   Ascorbic Acid (VITAMIN C) 1000 MG tablet Take 1,000 mg by mouth daily. Taking 1 tablet daily   aspirin EC 81 MG tablet Take 1 tablet (81 mg total) by mouth daily.   carvedilol (COREG) 12.5 MG tablet TAKE 1 TABLET(12.5 MG) BY MOUTH TWICE DAILY   Coenzyme Q10 (CO Q-10) 100 MG CAPS Take 1 capsule by mouth daily.   omeprazole (PRILOSEC) 20 MG capsule Take 20 mg by mouth daily as needed.   rosuvastatin (CRESTOR) 10 MG tablet Take 1 tablet (10 mg total) by mouth daily.   sacubitril-valsartan (ENTRESTO) 49-51 MG Take 1 tablet by mouth 2 (two) times daily.   spironolactone (ALDACTONE) 25 MG tablet Take 1 tablet (25 mg total) by mouth daily.   ZINC GLUCONATE PO Take by mouth daily.    Allergies:   Patient has no known allergies.   Social History   Socioeconomic History   Marital status: Married    Spouse name: Not on file   Number of children: Not on file   Years of education: Not on file   Highest education level: Not on file  Occupational History   Not on file  Tobacco Use   Smoking status: Former    Current packs/day: 0.00    Average packs/day: 1 pack/day for 30.0 years (30.0 ttl pk-yrs)    Types: Cigarettes    Start date: 29    Quit date: 1999    Years since quitting: 26.3   Smokeless tobacco: Never  Vaping Use   Vaping status: Never Used  Substance and Sexual Activity   Alcohol use: Yes    Alcohol/week: 8.0 standard drinks of alcohol    Types: 1 Standard drinks or equivalent, 7 Glasses of wine per week   Drug use: No   Sexual activity: Not on file  Other Topics Concern   Not on file  Social History Narrative   Not on file   Social Drivers of Health   Financial Resource Strain: Not on  file  Food Insecurity: Not on file  Transportation Needs: Not on file  Physical Activity: Not on file  Stress: Not on file  Social Connections: Not on file     Family History:  The patient's family history includes COPD in his mother; Cancer in his father; Heart disease in his mother.  ROS:   12-point review of systems is negative unless otherwise noted in the HPI.   EKGs/Labs/Other Studies Reviewed:    Studies reviewed were summarized above. The additional studies were reviewed today:  2D echo 10/09/2022: 1. Left ventricular ejection fraction, by estimation, is 45 to 50%. Left  ventricular ejection fraction by 3D volume is 45 %. Left ventricular  ejection fraction by 2D MOD  biplane is 47.0 %. The left ventricle has  mildly decreased function. The left  ventricle has no regional wall motion abnormalities. Left ventricular  diastolic parameters are consistent with Grade I diastolic dysfunction  (impaired relaxation). The average left ventricular global longitudinal  strain is -15.8 %.   2. Right ventricular systolic function is normal. The right ventricular  size is normal.   3. Left atrial size was severely dilated.   4. The mitral valve is normal in structure. Mild mitral valve  regurgitation. No evidence of mitral stenosis.   5. The aortic valve has an indeterminant number of cusps. Aortic valve  regurgitation is not visualized. Aortic valve sclerosis is present, with  no evidence of aortic valve stenosis.   6. There is borderline dilatation of the aortic root, measuring 37 mm.  There is borderline dilatation of the ascending aorta, measuring 38 mm.   7. The inferior vena cava is normal in size with greater than 50%  respiratory variability, suggesting right atrial pressure of 3 mmHg.  __________  2D echo with 12/29/2020: 1. Left ventricular ejection fraction, by estimation, is 40 to 45%. Left  ventricular ejection fraction by 3D volume is 45 %. The left ventricle has   mild to moderately decreased function. The left ventricle demonstrates  global hypokinesis. Left  ventricular diastolic parameters are consistent with Grade II diastolic  dysfunction (pseudonormalization). The average left ventricular global  longitudinal strain is -16.6 %.   2. Right ventricular systolic function is low normal. The right  ventricular size is normal.   3. Left atrial size was mildly dilated.   4. The mitral valve is normal in structure. Trivial mitral valve  regurgitation.   5. The aortic valve is tricuspid. Aortic valve regurgitation is not  visualized. Mild aortic valve sclerosis is present, with no evidence of  aortic valve stenosis.   6. Aortic dilatation noted. There is mild dilatation of the aortic root,  measuring 39 mm.  __________  2D echo 02/13/2018: - Left ventricle: The cavity size was normal. Wall thickness was    increased in a pattern of mild LVH. Systolic function was mildly    to moderately reduced. The estimated ejection fraction was in the    range of 40% to 45%. Diffuse hypokinesis. Doppler parameters are    consistent with abnormal left ventricular relaxation (grade 1    diastolic dysfunction).  - Aorta: Aortic root dimension: 38 mm (ED).  - Ascending aorta: The ascending aorta was mildly dilated.  - Mitral valve: Calcified annulus.  - Left atrium: The atrium was mildly dilated.   Impressions:   - EF is improved when compared to prior.  __________  [facility] 10/24/2017: Conclusions: Moderate, nonobstructive coronary artery disease involving the proximal LAD and proximal segment of large OM1 branch.  Findings are consistent with nonischemic cardiomyopathy. Mildly elevated left ventricular filling pressure (LVEDP 17 mmHg).   Recommendations: Initiate carvedilol 3.125 mg twice daily for nonischemic cardiomyopathy with moderate to severely reduced LVEF.  Anticipate adding ACEI/ARB/ARNI when patient returns for follow-up in 2 weeks. Continue  low-dose aspirin.  Consider adding statin at follow-up appointment to prevent progression of moderate CAD.  We will also check fasting lipid panel and ALT at that time. __________  2D echo 10/15/2017: - Left ventricle: The cavity size was moderately dilated. Wall    thickness was normal. Systolic function was moderately to    severely reduced. The estimated ejection fraction was in the    range of 30% to 35%.  Diffuse hypokinesis. Doppler parameters are    consistent with both elevated ventricular end-diastolic filling    pressure and elevated left atrial filling pressure.  - Mitral valve: Calcified annulus. There was mild regurgitation.  - Left atrium: The atrium was mildly dilated.  - Atrial septum: No defect or patent foramen ovale was identified.    EKG:  EKG is ordered today.  The EKG ordered today demonstrates NSR, 61 bpm, low amplitude P waves, nonspecific IVCD  Recent Labs: No results found for requested labs within last 365 days.  Recent Lipid Panel    Component Value Date/Time   CHOL 147 01/06/2019 0742   TRIG 100 01/06/2019 0742   HDL 61 01/06/2019 0742   CHOLHDL 2.4 01/06/2019 0742   LDLCALC 66 01/06/2019 0742    PHYSICAL EXAM:    VS:  BP 104/68 (BP Location: Left Arm, Patient Position: Sitting, Cuff Size: Large)   Pulse 61   Ht 6' (1.829 m)   Wt 200 lb (90.7 kg)   SpO2 95%   BMI 27.12 kg/m   BMI: Body mass index is 27.12 kg/m.  Physical Exam Vitals reviewed.  Constitutional:      Appearance: He is well-developed.  HENT:     Head: Normocephalic and atraumatic.  Eyes:     General:        Right eye: No discharge.        Left eye: No discharge.  Cardiovascular:     Rate and Rhythm: Normal rate and regular rhythm.     Heart sounds: Normal heart sounds, S1 normal and S2 normal. Heart sounds not distant. No midsystolic click and no opening snap. No murmur heard.    No friction rub.  Pulmonary:     Effort: Pulmonary effort is normal. No respiratory distress.      Breath sounds: Normal breath sounds. No decreased breath sounds, wheezing, rhonchi or rales.  Chest:     Chest wall: No tenderness.  Musculoskeletal:     Cervical back: Normal range of motion.     Right lower leg: No edema.     Left lower leg: No edema.  Skin:    General: Skin is warm and dry.     Nails: There is no clubbing.  Neurological:     Mental Status: He is alert and oriented to person, place, and time.  Psychiatric:        Speech: Speech normal.        Behavior: Behavior normal.        Thought Content: Thought content normal.        Judgment: Judgment normal.     Wt Readings from Last 3 Encounters:  10/16/23 200 lb (90.7 kg)  10/12/22 214 lb 9.6 oz (97.3 kg)  04/06/22 211 lb (95.7 kg)     ASSESSMENT & PLAN:   HFmrEF secondary to NICM: He is doing well and without symptoms of cardiac decompensation.  Most recent echo from 10/2022 showed continued gradual improvement in LV systolic function with EF up to 45 to 50%.  He remains on carvedilol 12.5 mg twice daily, Entresto 49/51 mg twice daily, and spironolactone 25 mg daily.  Not requiring a standing loop diuretic.  Obtain labs from PCP's office that were drawn last month.  Anticipate follow-up echo next year as outlined below.  CHF education.  Nonobstructive CAD: No symptoms suggestive of angina.  Continue aggressive risk factor modification and primary prevention including aspirin 81 mg, rosuvastatin 10 mg, and carvedilol 12.5 mg twice daily.  No indication for further ischemic testing at this time.  HTN: Blood pressure is well-controlled in the office today.  Continue pharmacotherapy as outlined above.  Borderline dilatation of the aortic root and ascending aorta: Stable on echo from 2024 when compared to echo from 2019.  Anticipate echo next year.  If there is a significant increase in aortic root or ascending aorta dilatation would pursue cross-sectional imaging.    Disposition: F/u with Dr. Nolan Battle or an APP in 12  months.   Medication Adjustments/Labs and Tests Ordered: Current medicines are reviewed at length with the patient today.  Concerns regarding medicines are outlined above. Medication changes, Labs and Tests ordered today are summarized above and listed in the Patient Instructions accessible in Encounters.   Signed, Varney Gentleman, PA-C 10/16/2023 12:50 PM     [facility] [place] [address] [place], [place] [address] [phone]

## 2023-10-16 ENCOUNTER — Ambulatory Visit: Attending: Physician Assistant | Admitting: Physician Assistant

## 2023-10-16 ENCOUNTER — Encounter: Payer: Self-pay | Admitting: Physician Assistant

## 2023-10-16 VITALS — BP 104/68 | HR 61 | Ht 72.0 in | Wt 200.0 lb

## 2023-10-16 DIAGNOSIS — I2510 Atherosclerotic heart disease of native coronary artery without angina pectoris: Secondary | ICD-10-CM | POA: Diagnosis not present

## 2023-10-16 DIAGNOSIS — I77810 Thoracic aortic ectasia: Secondary | ICD-10-CM

## 2023-10-16 DIAGNOSIS — I5022 Chronic systolic (congestive) heart failure: Secondary | ICD-10-CM

## 2023-10-16 DIAGNOSIS — I10 Essential (primary) hypertension: Secondary | ICD-10-CM | POA: Diagnosis not present

## 2023-10-16 DIAGNOSIS — I428 Other cardiomyopathies: Secondary | ICD-10-CM | POA: Diagnosis not present

## 2023-10-16 NOTE — Patient Instructions (Signed)
 Medication Instructions:  Your Physician recommend you continue on your current medication as directed.    *If you need a refill on your cardiac medications before your next appointment, please call your pharmacy*  Lab Work: None ordered at this time  If you have labs (blood work) drawn today and your tests are completely normal, you will receive your results only by: MyChart Message (if you have MyChart) OR A paper copy in the mail If you have any lab test that is abnormal or we need to change your treatment, we will call you to review the results.  Testing/Procedures: Your physician has requested that you have an echocardiogram in 12 months. Echocardiography is a painless test that uses sound waves to create images of your heart. It provides your doctor with information about the size and shape of your heart and how well your heart's chambers and valves are working.   You may receive an ultrasound enhancing agent through an IV if needed to better visualize your heart during the echo. This procedure takes approximately one hour.  There are no restrictions for this procedure.  This will take place at [address] [facility]) [address], [place] [address]  Please note: We ask at that you not bring children with you during ultrasound (echo/ vascular) testing. Due to room size and safety concerns, children are not allowed in the ultrasound rooms during exams. Our front office staff cannot provide observation of children in our lobby area while testing is being conducted. An adult accompanying a patient to their appointment will only be allowed in the ultrasound room at the discretion of the ultrasound technician under special circumstances. We apologize for any inconvenience.   Follow-Up: At [facility], you and your health needs are our priority.  As part of our continuing mission to provide you with exceptional heart care, our providers are all part of one team.  This  team includes your primary Cardiologist (physician) and Advanced Practice Providers or APPs (Physician Assistants and Nurse Practitioners) who all work together to provide you with the care you need, when you need it.  Your next appointment:   12 month(s) same day as Echo  Provider:   You may see Sammy Crisp, MD or Varney Gentleman, PA-C

## 2023-10-18 ENCOUNTER — Other Ambulatory Visit: Payer: Self-pay | Admitting: Internal Medicine

## 2023-10-21 MED ORDER — CARVEDILOL 12.5 MG PO TABS: ORAL_TABLET | ORAL | 3 refills | Status: DC

## 2023-10-31 ENCOUNTER — Encounter: Payer: Self-pay | Admitting: Internal Medicine

## 2023-10-31 MED ORDER — ROSUVASTATIN CALCIUM 10 MG PO TABS: 10.0000 mg | ORAL_TABLET | Freq: Every day | ORAL | 2 refills | Status: DC

## 2023-11-03 ENCOUNTER — Other Ambulatory Visit: Payer: Self-pay | Admitting: Nurse Practitioner

## 2023-11-05 ENCOUNTER — Other Ambulatory Visit: Payer: Self-pay | Admitting: Emergency Medicine

## 2023-11-05 MED ORDER — SPIRONOLACTONE 25 MG PO TABS: 25.0000 mg | ORAL_TABLET | Freq: Every day | ORAL | 2 refills | Status: AC

## 2023-12-23 ENCOUNTER — Other Ambulatory Visit: Payer: Self-pay

## 2023-12-23 ENCOUNTER — Emergency Department (HOSPITAL_COMMUNITY)

## 2023-12-23 ENCOUNTER — Emergency Department (HOSPITAL_COMMUNITY)
Admission: EM | Admit: 2023-12-23 | Discharge: 2023-12-23 | Disposition: A | Discharge status: Home / Self Care | Attending: Emergency Medicine | Admitting: Emergency Medicine

## 2023-12-23 ENCOUNTER — Encounter (HOSPITAL_COMMUNITY): Payer: Self-pay

## 2023-12-23 DIAGNOSIS — M25531 Pain in right wrist: Secondary | ICD-10-CM | POA: Diagnosis not present

## 2023-12-23 DIAGNOSIS — M19031 Primary osteoarthritis, right wrist: Secondary | ICD-10-CM | POA: Diagnosis not present

## 2023-12-23 DIAGNOSIS — I499 Cardiac arrhythmia, unspecified: Secondary | ICD-10-CM | POA: Diagnosis not present

## 2023-12-23 DIAGNOSIS — I5022 Chronic systolic (congestive) heart failure: Secondary | ICD-10-CM | POA: Insufficient documentation

## 2023-12-23 DIAGNOSIS — S60811A Abrasion of right wrist, initial encounter: Secondary | ICD-10-CM | POA: Diagnosis not present

## 2023-12-23 DIAGNOSIS — Z79899 Other long term (current) drug therapy: Secondary | ICD-10-CM | POA: Insufficient documentation

## 2023-12-23 DIAGNOSIS — S61411A Laceration without foreign body of right hand, initial encounter: Secondary | ICD-10-CM | POA: Insufficient documentation

## 2023-12-23 DIAGNOSIS — S0990XA Unspecified injury of head, initial encounter: Secondary | ICD-10-CM | POA: Insufficient documentation

## 2023-12-23 DIAGNOSIS — W19XXXA Unspecified fall, initial encounter: Secondary | ICD-10-CM

## 2023-12-23 DIAGNOSIS — W1830XA Fall on same level, unspecified, initial encounter: Secondary | ICD-10-CM | POA: Diagnosis not present

## 2023-12-23 DIAGNOSIS — Y908 Blood alcohol level of 240 mg/100 ml or more: Secondary | ICD-10-CM | POA: Insufficient documentation

## 2023-12-23 DIAGNOSIS — Y92000 Kitchen of unspecified non-institutional (private) residence as  the place of occurrence of the external cause: Secondary | ICD-10-CM | POA: Diagnosis not present

## 2023-12-23 DIAGNOSIS — S51811A Laceration without foreign body of right forearm, initial encounter: Secondary | ICD-10-CM | POA: Diagnosis not present

## 2023-12-23 DIAGNOSIS — R9089 Other abnormal findings on diagnostic imaging of central nervous system: Secondary | ICD-10-CM | POA: Diagnosis not present

## 2023-12-23 DIAGNOSIS — S0001XA Abrasion of scalp, initial encounter: Secondary | ICD-10-CM | POA: Insufficient documentation

## 2023-12-23 DIAGNOSIS — S61511A Laceration without foreign body of right wrist, initial encounter: Secondary | ICD-10-CM | POA: Insufficient documentation

## 2023-12-23 DIAGNOSIS — F1010 Alcohol abuse, uncomplicated: Secondary | ICD-10-CM | POA: Diagnosis not present

## 2023-12-23 DIAGNOSIS — I491 Atrial premature depolarization: Secondary | ICD-10-CM | POA: Diagnosis not present

## 2023-12-23 DIAGNOSIS — S0003XA Contusion of scalp, initial encounter: Secondary | ICD-10-CM | POA: Diagnosis not present

## 2023-12-23 DIAGNOSIS — I110 Hypertensive heart disease with heart failure: Secondary | ICD-10-CM | POA: Diagnosis not present

## 2023-12-23 DIAGNOSIS — I428 Other cardiomyopathies: Secondary | ICD-10-CM | POA: Diagnosis not present

## 2023-12-23 DIAGNOSIS — S199XXA Unspecified injury of neck, initial encounter: Secondary | ICD-10-CM | POA: Diagnosis not present

## 2023-12-23 DIAGNOSIS — R609 Edema, unspecified: Secondary | ICD-10-CM | POA: Diagnosis not present

## 2023-12-23 DIAGNOSIS — R58 Hemorrhage, not elsewhere classified: Secondary | ICD-10-CM | POA: Diagnosis not present

## 2023-12-23 DIAGNOSIS — Z7982 Long term (current) use of aspirin: Secondary | ICD-10-CM | POA: Diagnosis not present

## 2023-12-23 DIAGNOSIS — R4182 Altered mental status, unspecified: Secondary | ICD-10-CM | POA: Diagnosis not present

## 2023-12-23 DIAGNOSIS — M25521 Pain in right elbow: Secondary | ICD-10-CM | POA: Diagnosis not present

## 2023-12-23 LAB — CBC
HCT: 39.5 % (ref 39.0–52.0)
Hemoglobin: 13.5 g/dL (ref 13.0–17.0)
MCH: 33.5 pg (ref 26.0–34.0)
MCHC: 34.2 g/dL (ref 30.0–36.0)
MCV: 98 fL (ref 80.0–100.0)
Platelets: 146 10*3/uL — ABNORMAL LOW (ref 150–400)
RBC: 4.03 MIL/uL — ABNORMAL LOW (ref 4.22–5.81)
RDW: 13 % (ref 11.5–15.5)
WBC: 7.2 10*3/uL (ref 4.0–10.5)
nRBC: 0 % (ref 0.0–0.2)

## 2023-12-23 LAB — COMPREHENSIVE METABOLIC PANEL WITH GFR
ALT: 15 U/L (ref 0–44)
AST: 20 U/L (ref 15–41)
Albumin: 4.1 g/dL (ref 3.5–5.0)
Alkaline Phosphatase: 32 U/L — ABNORMAL LOW (ref 38–126)
Anion gap: 14 (ref 5–15)
BUN: 17 mg/dL (ref 8–23)
CO2: 18 mmol/L — ABNORMAL LOW (ref 22–32)
Calcium: 9.5 mg/dL (ref 8.9–10.3)
Chloride: 99 mmol/L (ref 98–111)
Creatinine, Ser: 1.04 mg/dL (ref 0.61–1.24)
GFR, Estimated: >60 mL/min (ref >60)
Glucose, Bld: 107 mg/dL — ABNORMAL HIGH (ref 70–99)
Potassium: 4.2 mmol/L (ref 3.5–5.1)
Sodium: 131 mmol/L — ABNORMAL LOW (ref 135–145)
Total Bilirubin: 0.7 mg/dL (ref 0.0–1.2)
Total Protein: 7 g/dL (ref 6.5–8.1)

## 2023-12-23 LAB — ETHANOL: Alcohol, Ethyl (B): 283 mg/dL — ABNORMAL HIGH (ref <15)

## 2023-12-23 LAB — TROPONIN I (HIGH SENSITIVITY): Troponin I (High Sensitivity): 4 ng/L (ref <18)

## 2023-12-23 LAB — CK: Total CK: 118 U/L (ref 49–397)

## 2023-12-23 MED ORDER — SODIUM CHLORIDE 0.9 % IV BOLUS
1000.0000 mL | Freq: Once | INTRAVENOUS | Status: AC
  Administered 2023-12-23: 1000 mL via INTRAVENOUS

## 2023-12-23 MED ORDER — SODIUM CHLORIDE 0.9 % IV BOLUS (SEPSIS)
500.0000 mL | Freq: Once | INTRAVENOUS | Status: AC
  Administered 2023-12-23: 500 mL via INTRAVENOUS

## 2023-12-23 NOTE — ED Provider Notes (Signed)
 [name] [facility] AT [facility] Provider Note   CSN: [id number] Arrival date & time: 12/23/23  9945     Patient presents with: Frederick Goodwin is a 78 y.o. male with history of PVCs, arrhythmias, nonischemic cardiomyopathy, hypertension, chronic HFrEF.  Patient presents to ED for evaluation of fall.  Patient unable to provide much history.  Patient clinically intoxicated. States he drank 2 glasses of wine tonight.  Patient reports that he remembers drinking alcohol tonight then blacked out, remembers waking up on the floor.  Unknown downtime, mechanism.  Patient apparently crawled from his kitchen to his living room where he was found sitting by EMS.  Unknown amount of time on the ground.  Superficial abrasions to the right elbow right wrist.  Superficial abrasion to posterior scalp.  No blood thinners.  Denies any chest pain or shortness of breath prior to the fall but states he is unable to fully recount events.  Patient reports he is up-to-date on tetanus.   Fall       Prior to Admission medications   Medication Sig Start Date End Date Taking? Authorizing Provider  Ascorbic Acid (VITAMIN C) 1000 MG tablet Take 1,000 mg by mouth daily. Taking 1 tablet daily    [provider]  aspirin  EC 81 MG tablet Take 1 tablet (81 mg total) by mouth daily. 10/16/17   End, Lonni, MD  carvedilol  (COREG ) 12.5 MG tablet TAKE 1 TABLET(12.5 MG) BY MOUTH TWICE DAILY 10/21/23   End, Lonni, MD  Coenzyme Q10 (CO Q-10) 100 MG CAPS Take 1 capsule by mouth daily.    [provider]  omeprazole (PRILOSEC) 20 MG capsule Take 20 mg by mouth daily as needed.    [provider]  rosuvastatin  (CRESTOR ) 10 MG tablet Take 1 tablet (10 mg total) by mouth daily. 10/31/23   Dunn, Bernardino HERO, PA-C  sacubitril-valsartan (ENTRESTO ) 49-51 MG Take 1 tablet by mouth 2 (two) times daily. 06/17/23   End, Lonni, MD  spironolactone  (ALDACTONE ) 25 MG tablet Take 1  tablet (25 mg total) by mouth daily. 11/05/23   Vivienne Lonni Ingle, NP  ZINC GLUCONATE PO Take by mouth daily.    [provider]    Allergies: Patient has no known allergies.    Review of Systems  Unable to perform ROS: Acuity of condition (Level 5 caveat)  All other systems reviewed and are negative.   Updated Vital Signs BP 113/63   Pulse 68   Temp 97.8 F (36.6 C) (Oral)   Resp 14   SpO2 96%   Physical Exam Vitals and nursing note reviewed.  Constitutional:      General: He is not in acute distress.    Appearance: He is well-developed.  HENT:     Head: Normocephalic.     Comments: Superficial abrasion to posterior scalp  Eyes:     Conjunctiva/sclera: Conjunctivae normal.   Neck:     Comments: No tenderness to cervical spine. Cardiovascular:     Rate and Rhythm: Normal rate and regular rhythm.     Heart sounds: No murmur heard. Pulmonary:     Effort: Pulmonary effort is normal. No respiratory distress.     Breath sounds: Normal breath sounds.  Abdominal:     Palpations: Abdomen is soft.     Tenderness: There is no abdominal tenderness.   Musculoskeletal:        General: No swelling.     Cervical back: Neck supple.  Comments: Full ROM of bilateral ankles, knees, hips.  No tenderness to hips.  Full ROM of bilateral wrists, elbows, shoulders.  No deformity noted.   Skin:    General: Skin is warm and dry.     Capillary Refill: Capillary refill takes less than 2 seconds.     Comments: Skin tear to dorsum of right hand, right wrist, right forearm.  No laceration requiring suturing.   Neurological:     Mental Status: He is alert and oriented to person, place, and time. Mental status is at baseline.     Comments: Moves all extremities in a coordinated fashion.  Strength, sensation intact and equal throughout.  CN III through XII intact.  PERRL.  Tracks cross midline.  Alert and oriented to baseline.  Intact finger-nose, heel-to-shin.  Psychiatric:         Mood and Affect: Mood normal.     (all labs ordered are listed, but only abnormal results are displayed) Labs Reviewed  CBC - Abnormal; Notable for the following components:      Result Value   RBC 4.03 (*)    Platelets 146 (*)    All other components within normal limits  COMPREHENSIVE METABOLIC PANEL WITH GFR - Abnormal; Notable for the following components:   Sodium 131 (*)    CO2 18 (*)    Glucose, Bld 107 (*)    Alkaline Phosphatase 32 (*)    All other components within normal limits  ETHANOL - Abnormal; Notable for the following components:   Alcohol, Ethyl (B) 283 (*)    All other components within normal limits  CK  TROPONIN I (HIGH SENSITIVITY)    EKG: EKG Interpretation Date/Time:  Monday December 23 2023 01:07:34 EDT Ventricular Rate:  70 PR Interval:  188 QRS Duration:  134 QT Interval:  412 QTC Calculation: 445 R Axis:   -54  Text Interpretation: Sinus or ectopic atrial rhythm Nonspecific IVCD with LAD Probable anteroseptal infarct, old Confirmed by Griselda Norris [phone]) on 12/23/2023 2:29:03 AM  Radiology: CT Elbow Right Wo Contrast Result Date: 12/23/2023 CLINICAL DATA:  Fall, right elbow pain EXAM: CT OF THE UPPER RIGHT EXTREMITY WITHOUT CONTRAST TECHNIQUE: Multidetector CT imaging of the upper right extremity was performed according to the standard protocol. RADIATION DOSE REDUCTION: This exam was performed according to the departmental dose-optimization program which includes automated exposure control, adjustment of the mA and/or kV according to patient size and/or use of iterative reconstruction technique. COMPARISON:  None Available. FINDINGS: Bones/Joint/Cartilage Normal alignment. No acute fracture or dislocation mild radiocapitellar and ulnar humeral degenerative arthritis. No lytic or blastic bone lesion. Ligaments Suboptimally assessed by CT. Muscles and Tendons Unremarkable Soft tissues Mild subcutaneous edema seen superficial to the olecranon. No  loculated subcutaneous fluid collection identified. IMPRESSION: 1. No acute fracture or dislocation. 2. Mild radiocapitellar and ulnar humeral degenerative arthritis. 3. Mild subcutaneous edema seen superficial to the olecranon Electronically Signed   By: Dorethia Molt M.D.   On: 12/23/2023 03:19   CT Head Wo Contrast Result Date: 12/23/2023 CLINICAL DATA:  Head trauma, moderate-severe; Neck trauma, intoxicated or obtunded (Age >= 16y) EXAM: CT HEAD WITHOUT CONTRAST CT CERVICAL SPINE WITHOUT CONTRAST TECHNIQUE: Multidetector CT imaging of the head and cervical spine was performed following the standard protocol without intravenous contrast. Multiplanar CT image reconstructions of the cervical spine were also generated. RADIATION DOSE REDUCTION: This exam was performed according to the departmental dose-optimization program which includes automated exposure control, adjustment of the mA and/or kV according  to patient size and/or use of iterative reconstruction technique. COMPARISON:  None Available. FINDINGS: CT HEAD FINDINGS Brain: Normal anatomic configuration. Parenchymal volume loss is commensurate with the patient's age. Mild periventricular white matter changes are present likely reflecting the sequela of small vessel ischemia. No abnormal intra or extra-axial mass lesion or fluid collection. No abnormal mass effect or midline shift. No evidence of acute intracranial hemorrhage or infarct. Ventricular size is normal. Cerebellum unremarkable. Vascular: No asymmetric hyperdense vasculature at the skull base. Skull: Intact Sinuses/Orbits: Paranasal sinuses are clear. Orbits are unremarkable. Other: Mastoid air cells and middle ear cavities are clear. Small right frontal scalp hematoma. CT CERVICAL SPINE FINDINGS Alignment: 2 mm anterolisthesis C2-3 and 2 mm retrolisthesis C5-6, likely degenerative in nature. No traumatic listhesis. Skull base and vertebrae: Craniocervical alignment is normal. The atlantodental  interval is not widened. No acute fracture of the cervical spine. Vertebral body height is preserved. Ankylosis of the facet joints of C2-3. Soft tissues and spinal canal: No prevertebral fluid or swelling. No visible canal hematoma. Posterior disc osteophyte complex at C5-6 results in mild central canal stenosis with abutment and minimal remodeling of the thecal sac. Spinal canal is otherwise widely patent. Disc levels: Intervertebral disc space narrowing and endplate remodeling, most severe at C3-4 and C5-6 is present in keeping with changes of advanced degenerative disc disease. Prevertebral soft tissues are not thickened on sagittal reformats. Multilevel uncovertebral and facet arthrosis results in multilevel moderate neuroforaminal narrowing. Upper chest: Emphysema Other: None IMPRESSION: 1. No acute intracranial hemorrhage or infarct. 2. Small right frontal scalp hematoma. No calvarial fracture. 3. No acute fracture or traumatic listhesis of the cervical spine. 4. Multilevel degenerative disc disease and facet arthrosis resulting in multilevel moderate neuroforaminal narrowing. Electronically Signed   By: Dorethia Molt M.D.   On: 12/23/2023 02:28   CT Cervical Spine Wo Contrast Result Date: 12/23/2023 CLINICAL DATA:  Head trauma, moderate-severe; Neck trauma, intoxicated or obtunded (Age >= 16y) EXAM: CT HEAD WITHOUT CONTRAST CT CERVICAL SPINE WITHOUT CONTRAST TECHNIQUE: Multidetector CT imaging of the head and cervical spine was performed following the standard protocol without intravenous contrast. Multiplanar CT image reconstructions of the cervical spine were also generated. RADIATION DOSE REDUCTION: This exam was performed according to the departmental dose-optimization program which includes automated exposure control, adjustment of the mA and/or kV according to patient size and/or use of iterative reconstruction technique. COMPARISON:  None Available. FINDINGS: CT HEAD FINDINGS Brain: Normal  anatomic configuration. Parenchymal volume loss is commensurate with the patient's age. Mild periventricular white matter changes are present likely reflecting the sequela of small vessel ischemia. No abnormal intra or extra-axial mass lesion or fluid collection. No abnormal mass effect or midline shift. No evidence of acute intracranial hemorrhage or infarct. Ventricular size is normal. Cerebellum unremarkable. Vascular: No asymmetric hyperdense vasculature at the skull base. Skull: Intact Sinuses/Orbits: Paranasal sinuses are clear. Orbits are unremarkable. Other: Mastoid air cells and middle ear cavities are clear. Small right frontal scalp hematoma. CT CERVICAL SPINE FINDINGS Alignment: 2 mm anterolisthesis C2-3 and 2 mm retrolisthesis C5-6, likely degenerative in nature. No traumatic listhesis. Skull base and vertebrae: Craniocervical alignment is normal. The atlantodental interval is not widened. No acute fracture of the cervical spine. Vertebral body height is preserved. Ankylosis of the facet joints of C2-3. Soft tissues and spinal canal: No prevertebral fluid or swelling. No visible canal hematoma. Posterior disc osteophyte complex at C5-6 results in mild central canal stenosis with abutment and minimal remodeling of the  thecal sac. Spinal canal is otherwise widely patent. Disc levels: Intervertebral disc space narrowing and endplate remodeling, most severe at C3-4 and C5-6 is present in keeping with changes of advanced degenerative disc disease. Prevertebral soft tissues are not thickened on sagittal reformats. Multilevel uncovertebral and facet arthrosis results in multilevel moderate neuroforaminal narrowing. Upper chest: Emphysema Other: None IMPRESSION: 1. No acute intracranial hemorrhage or infarct. 2. Small right frontal scalp hematoma. No calvarial fracture. 3. No acute fracture or traumatic listhesis of the cervical spine. 4. Multilevel degenerative disc disease and facet arthrosis resulting in  multilevel moderate neuroforaminal narrowing. Electronically Signed   By: Dorethia Molt M.D.   On: 12/23/2023 02:28   DG Wrist Complete Right Result Date: 12/23/2023 CLINICAL DATA:  Recent fall with wrist pain, initial encounter EXAM: RIGHT WRIST - COMPLETE 3+ VIEW COMPARISON:  None Available. FINDINGS: There is no evidence of fracture or dislocation. There is no evidence of arthropathy or other focal bone abnormality. Soft tissues are unremarkable. IMPRESSION: No acute abnormality noted. Electronically Signed   By: Oneil Devonshire M.D.   On: 12/23/2023 02:05   DG Elbow Complete Right Result Date: 12/23/2023 CLINICAL DATA:  Recent fall with right elbow pain, initial encounter EXAM: RIGHT ELBOW - COMPLETE 3+ VIEW COMPARISON:  None Available. FINDINGS: Mild cortical irregularity is noted in the distal humerus adjacent to the articulation with the radius. No significant joint effusion is noted. No other focal abnormality is seen. IMPRESSION: Mild cortical irregularity of the distal humerus suspicious for fracture. No joint effusion is noted. Cross-sectional imaging may be helpful. Electronically Signed   By: Oneil Devonshire M.D.   On: 12/23/2023 02:01    Procedures   Medications Ordered in the ED  sodium chloride  0.9 % bolus 1,000 mL (0 mLs Intravenous Stopped 12/23/23 0228)  sodium chloride  0.9 % bolus 500 mL (0 mLs Intravenous Stopped 12/23/23 0339)    Clinical Course as of 12/23/23 0404  Mon Dec 23, 2023  0244 Patient daughter concerned about patient receiving 1L of NS due to EF. Patient order changed to 500 bag of NS. No evidence of volume overload on exam.  [CG]    Clinical Course User Index [CG] Ruthell Lonni FALCON, PA-C   Medical Decision Making Amount and/or Complexity of Data Reviewed Labs: ordered. Radiology: ordered.   78 year old male presents for evaluation.  Please see HPI for further details.  On examination the patient is afebrile and nontachycardic.  His lung sounds are clear  bilaterally, he is not hypoxic.  Abdomen is soft and compressible.  Neurological examination at baseline.  Patient with skin tear to dorsum of right hand, right wrist, right forearm.  He has superficial abrasion to the back of his head.  He has full ROM of bilateral ankles, knees, hips.  Full ROM of bilateral wrists, elbows and shoulders.  No deformity noted throughout.  Patient does appear intoxicated.  Will assess with CBC, CMP, troponin of is 1, CK and ethanol.  Will image utilizing CT head, CT cervical spine, plain film imaging of right wrist, plain film image of right elbow.  Initially planned to provide patient with 1 L of fluid.  Patient daughter concerned about patient ejection fraction.  Chart reviewed, patient ejection fraction 45 to 50% based on most [place] echocardiogram.  Patient fluid order changed to 500 mL at this time.  Update: Patient lab work reveals a CBC without leukocytosis or anemia.  Metabolic panel with sodium 131, glucose 107, alk phos 32, no transaminitis, no anion  gap elevation.  Patient troponin 4, denies chest pain.  No delta will be collected.  His EKG is nonischemic.  His CK is 118.  His ethanol is 283.  Patient imaging reveals CT head without acute finding.  CT cervical spine without acute finding.  X-ray of right wrist unremarkable.  X-ray of right elbow shows cortical irregularity raise concern for possible distal humerus fracture.  The patient denies any pain to his right elbow but he is clinically intoxicated.  Will proceed with CT of his right elbow to rule out any fracture.  Update: Patient CT scan of right elbow unremarkable.  No fracture noted.  Patient wounds cleansed appropriately and dressed.  At this time, patient reports he feels comfortable going home.  The patient has his daughter at the bedside.  She reports that she will bring the patient home this evening and stay with the patient.  She states that she feels comfortable taking care of the patient at  home.  Patient is stable to discharge at this time.   Final diagnoses:  Fall, initial encounter  Minor head injury, initial encounter  Skin tear of right hand without complication, initial encounter  Skin tear of right forearm without complication, initial encounter    ED Discharge Orders     None          Ruthell Lonni JULIANNA DEVONNA 12/23/23 0404    Griselda Norris, MD 12/23/23 [phone]

## 2023-12-23 NOTE — ED Notes (Signed)
 Patient's skin tears on right forearm and wrist irrigated and dressed along with laceration on the back of the head.

## 2023-12-23 NOTE — ED Triage Notes (Signed)
 Patient is presenting with a fall. Unknown time or mechanism of fall. Theory is patient fell in kitchen then crawled to the living room where he was found sitting. Unknown amount of time on the ground. ETOH on board, patient endorses alcohol use. Has a laceration of the back of his head, right elbow, and right wrist. No bleeding as of this time. Aox2, orientated to person and place.  EMS VS 141/76 BP 98% RA 98 CBG 70 HR

## 2023-12-23 NOTE — Discharge Instructions (Signed)
 It was a pleasure taking part in your care.  As discussed, please follow-up with your PCP at your earliest convenience.  Please continue taking all medications as prescribed.  Please return to the ED with any new or worsening symptoms.  Please care for wounds at home, cleanse and dress appropriately.

## 2023-12-23 NOTE — ED Notes (Addendum)
 Per daughter request original bolus of 1000ml normal saline was stopped and a 500ml bolus normal saline was started. Due to patient receiving approximately from the original bolus, daughter requested for patient to receive only 300ml more. PA Groce was notified, gave appropriate verbal orders, and said to only give of the bolus.

## 2024-03-02 ENCOUNTER — Encounter: Payer: Self-pay | Admitting: Internal Medicine

## 2024-03-04 MED ORDER — CARVEDILOL 6.25 MG PO TABS: 6.2500 mg | ORAL_TABLET | Freq: Two times a day (BID) | ORAL | 3 refills | Status: AC

## 2024-03-04 NOTE — Telephone Encounter (Signed)
 Please have Mr. Carrier cut his carvedilol  in half, reducing from 12.5 mg twice daily to 6.25 mg twice daily.  If he continues to have low blood pressures and/or dizziness, he should schedule an appointment with us  for further evaluation.  Lonni Hanson, MD [facility]

## 2024-03-20 ENCOUNTER — Other Ambulatory Visit: Payer: Self-pay | Admitting: Internal Medicine

## 2024-03-20 MED ORDER — SACUBITRIL-VALSARTAN 49-51 MG PO TABS: 1.0000 | ORAL_TABLET | Freq: Two times a day (BID) | ORAL | 2 refills | Status: DC

## 2024-04-08 ENCOUNTER — Other Ambulatory Visit: Payer: Self-pay

## 2024-04-08 MED ORDER — SACUBITRIL-VALSARTAN 49-51 MG PO TABS: 1.0000 | ORAL_TABLET | Freq: Two times a day (BID) | ORAL | 1 refills | Status: AC

## 2024-05-18 DIAGNOSIS — Z23 Encounter for immunization: Secondary | ICD-10-CM | POA: Diagnosis not present

## 2024-06-01 ENCOUNTER — Encounter: Payer: Self-pay | Admitting: Internal Medicine

## 2024-07-07 ENCOUNTER — Other Ambulatory Visit (HOSPITAL_COMMUNITY): Payer: Self-pay

## 2024-07-17 ENCOUNTER — Other Ambulatory Visit (HOSPITAL_COMMUNITY): Payer: Self-pay

## 2024-07-30 ENCOUNTER — Other Ambulatory Visit (HOSPITAL_COMMUNITY): Payer: Self-pay

## 2024-07-30 ENCOUNTER — Telehealth: Payer: Self-pay | Admitting: Emergency Medicine

## 2024-07-30 ENCOUNTER — Other Ambulatory Visit: Payer: Self-pay | Admitting: Emergency Medicine

## 2024-07-30 MED ORDER — ROSUVASTATIN CALCIUM 10 MG PO TABS
10.0000 mg | ORAL_TABLET | Freq: Every day | ORAL | 3 refills | Status: AC
  Filled 2024-07-30: qty 90, 90d supply, fill #0

## 2024-07-30 NOTE — Telephone Encounter (Signed)
 Sig: Take 1 tablet (10 mg total) by mouth daily.     Start Date: 07/30/24 End Date: -- Written Date: 07/30/24 Expiration Date: 07/30/25 Original Order: rosuvastatin  (CRESTOR ) 10 MG tablet [[id number]] Providers  Ordering and Authorizing Provider: Abigail Bernardino HERO, PA-C [address], [place] [address] Phone: [phone]   Fax: [phone] DEA #: [id number]   NPI: [id number] --    Ordering User: Hannah Marcus KIDD, RN    Pharmacy  [place] [facility] [address], [place] [address] Phone: [phone]  Fax: [phone] DEA #: [id number]  Pt called and confirmed pharmacy - med reordered

## 2024-08-03 ENCOUNTER — Other Ambulatory Visit (HOSPITAL_COMMUNITY): Payer: Self-pay

## 2024-08-07 ENCOUNTER — Other Ambulatory Visit (HOSPITAL_COMMUNITY): Payer: Self-pay

## 2024-08-31 ENCOUNTER — Ambulatory Visit

## 2024-09-23 ENCOUNTER — Ambulatory Visit: Admitting: Internal Medicine

## 2024-10-20 ENCOUNTER — Ambulatory Visit: Admitting: Physician Assistant
